# Patient Record
Sex: Female | Born: 1945
Health system: Southern US, Community
[De-identification: ages and names within clinical notes are randomized; demographics above are authoritative.]

## PROBLEM LIST (undated history)

## (undated) DIAGNOSIS — H269 Unspecified cataract: Secondary | ICD-10-CM

## (undated) DIAGNOSIS — I10 Essential (primary) hypertension: Secondary | ICD-10-CM

## (undated) DIAGNOSIS — I471 Supraventricular tachycardia, unspecified: Secondary | ICD-10-CM

## (undated) DIAGNOSIS — C541 Malignant neoplasm of endometrium: Secondary | ICD-10-CM

## (undated) DIAGNOSIS — C801 Malignant (primary) neoplasm, unspecified: Secondary | ICD-10-CM

## (undated) DIAGNOSIS — M1990 Unspecified osteoarthritis, unspecified site: Secondary | ICD-10-CM

## (undated) DIAGNOSIS — K219 Gastro-esophageal reflux disease without esophagitis: Secondary | ICD-10-CM

## (undated) DIAGNOSIS — E785 Hyperlipidemia, unspecified: Secondary | ICD-10-CM

## (undated) HISTORY — PX: CATARACT EXTRACTION W/ INTRAOCULAR LENS IMPLANT: SHX1309

## (undated) HISTORY — DX: Malignant (primary) neoplasm, unspecified: C80.1

## (undated) HISTORY — DX: Malignant neoplasm of endometrium: C54.1

## (undated) HISTORY — DX: Hyperlipidemia, unspecified: E78.5

## (undated) HISTORY — DX: Unspecified cataract: H26.9

## (undated) HISTORY — PX: BREAST SURGERY: SHX581

## (undated) HISTORY — PX: ABLATION: SHX5711

## (undated) HISTORY — DX: Unspecified osteoarthritis, unspecified site: M19.90

## (undated) HISTORY — PX: EYE SURGERY: SHX253

---

## 1999-11-09 ENCOUNTER — Other Ambulatory Visit: Admission: RE | Admit: 1999-11-09 | Discharge: 1999-11-09 | Payer: Self-pay | Admitting: *Deleted

## 1999-12-01 ENCOUNTER — Observation Stay (HOSPITAL_COMMUNITY): Admission: EM | Admit: 1999-12-01 | Discharge: 1999-12-02 | Payer: Self-pay | Admitting: *Deleted

## 2000-02-08 ENCOUNTER — Encounter: Payer: Self-pay | Admitting: Internal Medicine

## 2000-02-08 ENCOUNTER — Ambulatory Visit (HOSPITAL_COMMUNITY): Admission: RE | Admit: 2000-02-08 | Discharge: 2000-02-09 | Payer: Self-pay | Admitting: Internal Medicine

## 2000-11-20 ENCOUNTER — Other Ambulatory Visit: Admission: RE | Admit: 2000-11-20 | Discharge: 2000-11-20 | Payer: Self-pay | Admitting: *Deleted

## 2001-01-15 ENCOUNTER — Inpatient Hospital Stay (HOSPITAL_COMMUNITY): Admission: EM | Admit: 2001-01-15 | Discharge: 2001-01-16 | Payer: Self-pay | Admitting: Emergency Medicine

## 2003-11-23 ENCOUNTER — Other Ambulatory Visit: Admission: RE | Admit: 2003-11-23 | Discharge: 2003-11-23 | Payer: Self-pay | Admitting: Internal Medicine

## 2003-12-16 ENCOUNTER — Ambulatory Visit: Payer: Self-pay | Admitting: Gastroenterology

## 2003-12-21 ENCOUNTER — Ambulatory Visit: Payer: Self-pay | Admitting: Gastroenterology

## 2006-01-02 ENCOUNTER — Other Ambulatory Visit: Admission: RE | Admit: 2006-01-02 | Discharge: 2006-01-02 | Payer: Self-pay | Admitting: Cardiology

## 2007-06-18 ENCOUNTER — Telehealth (INDEPENDENT_AMBULATORY_CARE_PROVIDER_SITE_OTHER): Payer: Self-pay

## 2007-06-18 ENCOUNTER — Ambulatory Visit: Payer: Self-pay | Admitting: Gastroenterology

## 2007-06-18 DIAGNOSIS — K219 Gastro-esophageal reflux disease without esophagitis: Secondary | ICD-10-CM

## 2007-06-18 DIAGNOSIS — K769 Liver disease, unspecified: Secondary | ICD-10-CM | POA: Insufficient documentation

## 2007-06-18 DIAGNOSIS — K625 Hemorrhage of anus and rectum: Secondary | ICD-10-CM

## 2007-06-18 DIAGNOSIS — D126 Benign neoplasm of colon, unspecified: Secondary | ICD-10-CM

## 2007-06-18 LAB — CONVERTED CEMR LAB
Basophils Absolute: 0 10*3/uL (ref 0.0–0.1)
Basophils Relative: 0.6 % (ref 0.0–1.0)
Eosinophils Absolute: 0.2 10*3/uL (ref 0.0–0.7)
Eosinophils Relative: 3.2 % (ref 0.0–5.0)
HCT: 42.9 % (ref 36.0–46.0)
Hemoglobin: 14.2 g/dL (ref 12.0–15.0)
Lymphocytes Relative: 38.1 % (ref 12.0–46.0)
MCHC: 33.1 g/dL (ref 30.0–36.0)
MCV: 87.1 fL (ref 78.0–100.0)
Monocytes Absolute: 0.4 10*3/uL (ref 0.1–1.0)
Monocytes Relative: 7.3 % (ref 3.0–12.0)
Neutro Abs: 3 10*3/uL (ref 1.4–7.7)
Neutrophils Relative %: 50.8 % (ref 43.0–77.0)
Platelets: 298 10*3/uL (ref 150–400)
RBC: 4.92 M/uL (ref 3.87–5.11)
RDW: 12.7 % (ref 11.5–14.6)
WBC: 5.8 10*3/uL (ref 4.5–10.5)

## 2007-07-01 ENCOUNTER — Ambulatory Visit: Payer: Self-pay | Admitting: Gastroenterology

## 2010-05-02 ENCOUNTER — Emergency Department (HOSPITAL_COMMUNITY)
Admission: EM | Admit: 2010-05-02 | Discharge: 2010-05-03 | Disposition: A | Discharge status: Home / Self Care | Payer: Medicare Other | Attending: Emergency Medicine | Admitting: Emergency Medicine

## 2010-05-02 DIAGNOSIS — M779 Enthesopathy, unspecified: Secondary | ICD-10-CM | POA: Insufficient documentation

## 2010-05-02 DIAGNOSIS — M25539 Pain in unspecified wrist: Secondary | ICD-10-CM | POA: Insufficient documentation

## 2010-06-17 NOTE — H&P (Signed)
[facility]  Patient:    Tiffany Gilmore, Tiffany Gilmore                        MRN: [id number] Adm. Date:  [id number] Attending:  Noland Fordyce                         History and Physical  CHIEF COMPLAINT:  Chest pain and dizziness.  HISTORY OF PRESENT ILLNESS:  This is a 65 year old black female who was under my primary care since November 09, 1999, and who presented with complaints of light-headedness and chest pain since mid-morning.  She had a similar-type presentation yesterday and the day before, but did not come into the doctor. She actually states she was not going to come in today but decided to at the last minute.  Her chest discomfort had been getting progressively worse over the last several hours.  In the office, an EKG was done which showed her heart rate to be greater than 200.  EMS was called; she was given adenosine 6 mg IV, which caused a rapid reduction in her heart rate to the 120s.  Of note, she did not respond to vagal maneuvers.  PAST MEDICAL HISTORY 1. SVT.  Further questioning reveals that this patient has one to two episodes    of palpitations each month.  She did not tell me about this on her first    visit, for which I reprimanded her for. 2. Osteoarthritis. 3. Allergic rhinitis. 4. IBS. 5. Breast cancer in 1986. 6. History of some type of hepatitis. 7. Hypercholesterolemia. 8. Urethral strictures. 9. Colon polyp.  SURGICAL HISTORY:  Modified radical mastectomy on the right side in 1986. Several urethral dilations.  BTL.  FAMILY AND SOCIAL HISTORY:  Positive for diabetes in a brother.  Coronary artery disease in a brother at the age of 52; he had a heart attack.  Colon cancer in her mother.  No tobacco.  No alcohol.  She teaches math at a local college.  REVIEW OF SYSTEMS:  Generally, positive for weakness.  HEENT:  Dizziness. CARDIOVASCULAR:  Palpitations and chest pain.  RESPIRATORY:  Mild shortness of breath.  GI:  Negative.   GU:  Negative.  ID:  Negative.  MUSCULOSKELETAL: Negative.  All other systems negative.  PHYSICAL EXAMINATION  VITAL SIGNS:  Blood pressure 120/80.  Pulse was 200, decreased to 120s. Afebrile.  GENERAL:  Alert and cooperative, mildly anxious white female.  HEENT:  Extraocular movements intact.  Oropharynx clear.  Mucosa moist.  NECK:  No bruits.  Supple.  HEART:  Tachycardic.  Regular rhythm.  No murmurs, rubs, or gallops.  LUNGS:  Clear to auscultation bilaterally.  ABDOMEN:  Nondistended, nontender.  Soft.  Positive bowel sounds.  EXTREMITIES:  No cyanosis, clubbing or edema.  NEUROLOGIC:  Cranial nerves II-XII intact.  Gait normal.  LABORATORY DATA:  EKG shows narrowing QRS complex tachycardia at rate of 204.  IMPRESSION AND PLAN:  Paroxysmal supraventricular tachycardia:  With patients history, she certainly needs to be admitted to the hospital and get a cardiac workup.  I will put her on a telemetry bed, get cardiac enzymes serially as well as go ahead and start aspirin.  I gave her Toprol 25 mg in the office; I will continue that in the hospital and on an outpatient basis.  I will check a chest x-ray as well as electrolytes to see if this could be causing this  current problem. DD:  12/01/99 TD:  12/02/99 Job: [id number]

## 2010-06-17 NOTE — Consult Note (Signed)
[facility]  Patient:    Tiffany Gilmore, Tiffany Gilmore                        MRN: [id number] Proc. Date: 12/02/99 Adm. Date:  [id number] Disc. Date: [id number] Attending:  Lilia Pro D CC:         Dr. Silver Huguenin  Terald Sleeper, M.D.   Consultation Report  It is a pleasure to participate in the care of this very nice 65 year old Tiffany Gilmore, a patient of Drs. Leanord Hawking and [place], by providing consultation, at their requests, in regard to [name] episode of rapid heartbeat.  The patient presented to the office with light-headedness and chest pain which had been present since about 10:30.  She arrived at Dr. Laurance Flatten office about one oclock.  She was found to have paroxysmal supraventricular tachycardia with a quite rapid heart rate.  She received intravenous adenosine in the office, and had resolution of the PSCT.  She was hospitalized for further observation and evaluation.  PAST MEDICAL HISTORY:  Includes episodes of rapid heart rate for some years. She says they have rarely been as bothersome as this one was.  It was also associated with pain in her mid back and some dyspnea.  She did not find anything that made the episode better at this time.  She had been seen for a similar problem some years ago while she was under the care of Dr. Candie Echevaria. She did have a particularly bad spell last April.  She says that she usually takes low sodium aspirin and goes to bed when these spells hit, and they resolve that way.  She says that this time she was driving home and could not get across traffic as she was passing Dr. Laurance Flatten office, so went to the office instead of going home.  Past medical history includes right radical mastectomy for breast cancer in 1986, unexplained jaundice in 1989, which was thought possibly to be due to hepatitis or possibly a lightning surge, a prior C-section, urethral strictures, and hypercholesterolemia.  She has also had a  diagnosis of irritable bowel syndrome.  FAMILY HISTORY:  Her brother died early this year, probably of coronary artery disease, at 36.  Her mother died at 83 of colon cancer.  Another brother has diabetes.  She has two daughters who are 52 and 32.  SOCIAL HISTORY:  The patient is married.  She lives at home with her husband. She teaches algebra at [employer]  CURRENT MEDICATIONS:  She does not take any medication regularly.  REVIEW OF SYSTEMS:  CONSTITUTIONAL:  She denies fevers, chills, or sweats. She has no claudication, and has no exertional chest pain.  EYES:  She wears glasses.  She has no diplopia or blurring.  ENT:  No deafness or dizziness. She has a permanent partial left lower bridge.  CARDIOVASCULAR:  See the history of present illness.  RESPIRATORY:  She has mild dyspnea on exertion. She has never smoked.  GASTROINTESTINAL:  She has a history of irritable bowel syndrome and colon polyps, no recent dysphagia, nausea, or vomiting. GENITOURINARY:  She has urethral strictures and has had multiple urethral dilatations.  MUSCULOSKELETAL:  She has painful knees.  SKIN AND BREASTS:  No rash or nodule.  NEUROLOGIC:  No faintness or syncope.  She does have occasional headaches.  ENDOCRINE:  No known diabetes or thyroid disease. ALLERGIC/LYMPHATIC:  She has had breathing problems and rash with SULFA and stomach irritation when  she takes ASPIRIN regularly.  PHYSICAL EXAMINATION:  VITAL SIGNS:  Blood pressure 110/60, pulse 80 and regular, respirations 20 and unlabored, weight 198 pounds, height 4 feet 11 inches.  GENERAL:  She is a well-developed, well-nourished, obese, middle-aged woman now in no acute distress.  She is oriented to person, place, and time.  Her mood and affect are quite pleasant.  HEENT:  Conjunctivae and lids reveal no xanthelasma, icterus, or arcus senilis.  She has most of her own teeth which are in fairly good repair and the permanent bridge noted above.  The oral  mucosa reveals no pallor or cyanosis.  NECK:  Her neck is supple and symmetrical, trachea is midline and mobile. There are no palpable thyromegaly or cervical nodes, no carotid bruit or JVD.  RESPIRATORY:  Her respiratory effort is normal.  Her lungs are clear to auscultation and percussion.  BACK:  Her back is straight without kyphosis, scoliosis, or punch tenderness.  NEUROLOGIC:  Her gait is not tested.  She probably could undergo at least a partial stress test.  Muscle strength and tone are normal.  CARDIAC:  Cardiac apical impulse is cryptic.  The left border of cardiac dullness is within the left anterior axillary line.  The heart rhythm is regular, rate is normal.  There is no gallop, click, or murmur.  EXTREMITIES:  Her digits and nails reveal no clubbing or cyanosis.  Skin and subcutaneous tissue reveal no stasis dermatitis or ulcer.  ABDOMEN:  Her abdomen is obese and nontender, with normal bowel sounds.  There is no palpable enlargement of liver or spleen, and there is no abdominal aortic bruit.  The femoral arteries are without bruits.  The pedal pulses are difficult to feel.  I am not sure I feel any of them.  The appearance of her feet does not suggest ischemia, however.  Her legs reveal no edema or varicosity.  LABORATORY:  Available laboratory data include cardiac enzymes which are negative.  Her initial EKG showed paroxysmal supraventricular tachycardia with a heart rate of about 200.  Subsequent EKG is normal.  Mrs. Cassetta has a long history of paroxysmal supraventricular tachycardia, and has actually had quite a symptomatic episode at this time.  She apparently has never been on continuous medication for the suppression of this arrhythmia.  Her situation is of particular concern because her heart rate is so fast with the supraventricular tachycardia.  At 54, she tolerates this well hemodynamically.  At 62 she may very well not tolerate this very well  because  of the natural aging of the myocardium.  I think it would be worth her while to consider an electrophysiology study and possible _________ of the accessory pathway which is likely to be the cause of the paroxysmal supraventricular tachycardia.  In the meantime I think she can be discharged but should be on beta blockers.  I suggest Lopressor 50 mg a day or the equivalent.  She says that one of her daughters would like to discuss any further evaluation or treatment before she proceeds with it.  I have asked her to plan to come see me in the office the week after next, since I will be out of the office next week, and bring one of her daughters with her so we can discuss this further.  I appreciate the opportunity to see this nice lady.  Obviously if the beta blockers fail to keep her situation under control, then she will really need more aggressive measures. DD:  12/02/99  TD:  12/03/99 Job: [id number]

## 2010-06-17 NOTE — Discharge Summary (Signed)
[facility]  Patient:    Tiffany Gilmore, Tiffany Gilmore                        MRN: [id number] Adm. Date:  [id number] Disc. Date: [id number] Attending:  Lilia Pro D                           Discharge Summary  HISTORY:  This is a very pleasant 65 year old woman who is under the primary care of Dr. Bluford Main, who presented with light-headedness and chest pain since mid morning.  She had some palpitations on Monday and again on Tuesday, but these did not last long, and discussion with this further with the patient, she has a fairly protracted history of at least two episodes of palpitations per month.  During these times, she will lie down, take an aspirin, and the feeling usually passes spontaneously.  On the day of admission, she came to the office because the symptoms did not abate, and, in the office, her heart rate was 200.  She received adenosine which slowed the heart rate.  By the time she was admitted to [facility], she was back in sinus rhythm.  PAST MEDICAL HISTORY: 1. Supraventricular tachycardia, 1-2 episodes per month. 2. Osteoarthritis. 3. Allergic rhinitis. 4. Irritable bowel syndrome. 5. Breast carcinoma, status post mastectomy in 1986. 6. History of hepatitis (exact type uncertain). 7. Hypercholesterolemia. 8. Urethral strictures. 9. Colonic polyps.  SURGERIES:  Urethral dilatations, radical mastectomy on the right in 1986.  FAMILY/SOCIAL HISTORY:  Positive diabetes in her brother, coronary artery disease in a brother at age 40, positive colon carcinoma in mother.  No tobacco, no alcohol.  REVIEW OF SYSTEMS:  Otherwise negative.  PHYSICAL EXAMINATION:  VITAL SIGNS:  Blood pressure was 120/80, pulse 200, she was afebrile.  GENERAL:  This is an alert and cooperative woman in no distress.  HEENT:  Normal.  NECK:  No bruits.  CARDIOVASCULAR:  She was tachycardic, without murmurs, gallops, or rubs.  LUNGS:  Clear.  ABDOMEN:  Soft.  There  were no masses.  EXTREMITIES:  Without peripheral edema.  LABORATORY/OTHER INVESTIGATIONS:  Hemoglobin was 14.2, white count 4.9, platelets 286.  Sodium was 142, potassium 3.5, chloride 110, CO2 26, BUN 7, creatinine 0.8.  The PT PTT were normal.  The LFTs were normal.  Her CK, CK MB, and troponin I were all negative x 2.  The TSH was 2.16.  The EKG was normal.  COURSE IN HOSPITAL:  This patient was admitted with acute onset of supraventricular tachycardia.  She was started on Toprol XL.  She remained in sinus rhythm throughout the hospital stay.  As mentioned, she did receive adenosine in the office, prior to transport.  She was seen in consultation with Dr. Lucas Mallow, who generally feels that this is happening often enough and severe enough that she probably should be considered for an EPS study and catheter ablation if possible.  The patient prefers to think about this and will return to see Dr. Lucas Mallow in two weeks time.  DISCHARGE MEDICATIONS:  Toprol XL 50 mg per day.  FOLLOW-UP:  Dr. Lucas Mallow in two weeks and Dr. Bluford Main p.r.n. DD:  12/02/99 TD:  12/03/99 Job: [id number]

## 2010-06-17 NOTE — Discharge Summary (Signed)
[place]  Patient:    Tiffany Gilmore, Tiffany Gilmore                        MRN: [id number] Adm. Date:  [id number] Disc. Date: [id number] Attending:  Lewayne Bunting Dictator:   Chinita Pester, N.P. CC:         Lilia Pro, M.D.  Terald Sleeper, M.D.  Jaclyn Prime. Lucas Mallow, M.D.  Kathrine Cords, R.N.   Discharge Summary  PRIMARY DIAGNOSIS:  Supraventricular tachycardia.  SECONDARY DIAGNOSES:  None.  HISTORY OF PRESENT ILLNESS:  This is a 65 year old female with a history of tachypalpitations for many years.  In retrospect, the patient thinks that she had symptoms beginning as a teenager early in the 33s.  She had one particularly bad episode resulting in presentation to the emergency room. There very strong vagal maneuvers subsequently resulted in the termination of tachypalpitations.  She had rare prolonged palpitations until several months ago when she had a particularly bad episode in October, prompting her to visit the emergency department after being seen at her primary care physicians office.  She was treated with adenosine, observed, and subsequently discharged to home.  In the patient she has taken multiple medications, including beta blockers, which resulted in profound fatigue, and she is presently on Tiazac. She continued to have symptoms on these medications and notes that during her episodes of tachypalpitations, which start and stop suddenly that she has chest pain, shortness of breath, and near syncope.  The spells can last over an hour in duration, although they are usually of a shorter duration.  PAST SURGICAL HISTORY:  Positive for post mastectomy.  PAST MEDICAL HISTORY:  Transient liver dysfunction.  SOCIAL HISTORY:  Married with two daughters.  Denies tobacco or alcohol.  The patient was admitted for an SVT ablation.  She underwent an EP study and had a successful slow pathway modification of the visible AV nodal re-entrant tachycardia,  run during the tachycardia not inducible.  There were no immediate postoperative complications.  The patient tolerated the procedure well and was eventually discharged the following day in stable condition.  DISCHARGE MEDICATIONS:  She was instructed to take Tylenol p.r.n. for any discomfort.  She was to take a baby enteric-coated aspirin daily for six weeks and antibiotic prophylaxis for three months prior to any dental work.  ACTIVITY:  She is not to have strenuous activity for the next four days or driving for two days.  DIET:  Low-fat, low-cholesterol diet.  WOUND CARE:  She is to keep the puncture sites clean and dry.  To call if there is any drainage.  She was allowed to shower.  FOLLOW-UP:  The patient is to follow with Doylene Canning. Ladona Ridgel, M.D., on February 29, 2000, at 3:30 p.m. DD:  02/09/00 TD:  02/10/00 Job: [id number]

## 2010-06-17 NOTE — Procedures (Signed)
[place]  Patient:    Tiffany, Gilmore                        MRN: [id number] Proc. Date: 02/08/00 Adm. Date:  [id number] Attending:  Lewayne Bunting CC:         Jaclyn Prime. Lucas Mallow, M.D.  Lilia Pro, M.D.  Terald Sleeper, M.D.   Procedure Report  PROCEDURE PERFORMED:  Electrophysiology study and RF catheter ablation. of supraventricular tachycardia.  I:  INTRODUCTION:  The patient is very pleasant 65 year old woman, who has a documented history of SVT.  She had been hospitalized most recently back in November for evaluation.  The patient said that she has had symptoms beginning as a teenager, but early in the 1980s she began to have particularly bad episodes which typically could be terminated with vagal maneuvers.  She had a prolonged episode back in October, at which time she had documentation of her SVT and its termination with intravenous adenosine.  She had been taking calcium channel blockers, but unfortunately these have resulted in fatigue, and she is subsequently referred for electrophysiology study.  II:  DESCRIPTION OF PROCEDURE:  After informed consent was obtained, the patient was taken to the diagnostic EP Lab in a fasting state.  After the usual preparation and draping, intravenous fentanyl and midazolam were given for sedation.  A 6 French hexapolar catheter was inserted percutaneously in the right jugular vein, advanced to the coronary sinus.  A 5 French quadripolar catheter was inserted percutaneously in the right femoral vein and advanced to the RV apex.  A 5 French quadripolar catheter was inserted percutaneously in the right femoral vein and advanced to the His bundle region.  A 5 French quadripolar ablation catheter was inserted percutaneously in the right femoral vein and advanced to the right atrium.  After measurement at the basic intervals, rapid ventricular pacing was carried out from the RV apex at a pacing cycle  length of 490 msec and stepwise decreased down to 310 msec where VA Wenckebach was observed.  During rapid ventricular pacing, the atrial activation sequence was midline and decremental.  Next, programed ventricular stimulation was carried out from the RV apex at a basic dry cycle length at 500 msec.  The S1-S2 interval and stepwise decreased down to 230 msec where a ventricular refractoriness was observed.  During programed ventricular stimulation, the atrial activation sequence was once again midline and decremental.  Next, rapid atrial pacing was carried out from the coronary sinus with a basic cycle length of 490 msec, and stepwise decreased down to 310 msec where AV Wenckebach was observed.  During rapid atrial pacing, the P-R interval remained less than the R interval.  Next, programed atrial stimulation was carried out from the coronary sinus with basic dry cycle length of 500 msec.  The S1-S2 interval and stepwise decreased down to 230 msec where atrial refractoriness was observed.  It should be noted that during programed atrial stimulation, there was no age jump and no SVT.  Isoproterenol at 1 mcg/min. was then infused.  In addition, rapid atrial pacing was carried out from both coronary sinuses and a from the high right atrium; had a pacing cycle length of 290 msec from the high right atrium.  The patient had easily inducible AV nodal re-entry tachycardia.  This is characterized by PVCs placed at the time of His bundle refractoriness, demonstrated no atrial preexcitation, and from ventricular pacing demonstrated a V-A-V  conduction sequence.  The V-A interval was essentially 0 during SVT. The 5 French quadripolar catheter was removed from the right atrium, and the ablation catheter was maneuvered into the right atrium and mapping of Kochs triangle was carried out.  It should be noted that Kochs triangle was quite small.  A total of 9 RF energy applications were delivered to  Kochs triangle.  This resulted in multiple episodes of accelerated junctional rhythm.  It should be noted that during ablation the ablation catheter tended to move superiorly towards the AV node.  Despite this, no heart block was observed. Following the ninth RF energy application, additional rapid atrial pacing was carried out during isoproterenol infusion, and there was no evidence of recurrent inducible SVT.  Initially it was thought that the P-R interval remained greater than the R interval, but on careful inspection, there was a considerable latency and the P-R interval was actually less than the R interval.  At this point, the patient was observed for a total of 30 minutes and no additional SVT was observed during rapid atrial pacing.  The catheters were removed, hemostasis assured and the patient was returned to her room in good condition.  III:  COMPLICATIONS:  There were no immediate procedure complications.  IV:  RESULTS:      a. Baseline ECG:  The baseline 12-lead ECG demonstrates normal         sinus rhythm with normal axis and intervals.      b. Baseline Intervals:  Sinus cycle length was 645 msec.  The H-V         interval was 41 msec.      c. Rapid ventricular pacing:  Rapid ventricular pacing was carried out         from the RV apex and stepwise decreased down to 310 msec where         V-A Wenckebach was observed.      d. Programed ventricular stimulation:  Programed ventricular stimulation         was carried out from the RV apex at basic dry cycle lengths of 500         msec with the S1-S2 interval and stepwise decreased down to 230         msec where a ventricular refractoriness was observed.  During         programed ventricular stimulation, the atrial activation sequence         remained midline and decremental.      e. Rapid atrial pacing:  Rapid atrial pacing was carried out from both          high right atrium and coronary sinus with the A-V Wenckebach cycle          length of 310 msec at baseline and 240 msec during isoproterenol         infusion.      f. Programed atrial stimulation:  Programed atrial stimulation was         carried out from the coronary sinus as well as high right atrium at         basic dry cycle length of 504 msec.  The S1-S2 interval and stepwise         decreased down to atrial refractoriness, but there was no inducible         SVT. In addition, there were no age jumps.      g. Arrhythmia observed:  AV nodal re-entry tachycardia initiation.  Rapid atrial pacing during isoproterenol infusion.  Duration         sustained.  Termination with rapid atrial pacing.  The cycle length         was 288 msec.      h. Mapping:  Mapping with Kochs triangle demonstrated extremely compact         Kochs triangle.      i. RF energy application.  A total of 9 RF energy applications were         delivered to sites 6 through 8 in Kochs triangle.  During RF energy         application, the catheter would move up to site 5 and even site 4 and         Kochs triangle.  There was excellerated junctional rhythm during RF         energy application.  V:  CONCLUSION:  This study demonstrates successful slow pathway modification of inducible AV nodal re-entry tachycardia rendering the tachycardia noninducible.  There were no immediate procedure complications. DD:  02/08/00 TD:  02/08/00 Job: [id number]

## 2010-06-17 NOTE — Discharge Summary (Signed)
[place]. [facility]  Patient:    Tiffany Gilmore, Tiffany Gilmore Visit Number: [id number] MRN: [id number]          Service Type: MED Location: [phone] Attending Physician:  Clovis Cao Dictated by:   Jaclyn Prime Lucas Mallow, M.D. Admit Date:  01/15/2001 Discharge Date: 01/16/2001                             Discharge Summary  CHIEF COMPLAINT:  Chest pain.  HISTORY:  This 65 year old woman who had had an electrophysiology procedure two weeks ago to reduce the frequency and severity of her episodes of supraventricular tachycardia presented to the emergency room with a chief complaint of chest pain which had started shortly after her EP procedure.  The pain was described as sharp, in the left upper chest, worsened by nothing, relieved by nothing with onset at a time she could not recall, and of mild severity.  The pain was gone by the time she got to the emergency room.  The patient was admitted to the "rule out MI unit."  PAST MEDICAL HISTORY:  Recorded to the required degree by the emergency room physician.  SOCIAL HISTORY:  Recorded to the required degree by the emergency room physician.  FAMILY HISTORY:  Recorded to the required degree by the emergency room physician.  PHYSICAL EXAMINATION  VITAL SIGNS:  Temperature 97.7, pulse rate 64, respirations 20, blood pressure 112/64, height 4 feet 11 inches, weight 171 pounds.  GENERAL:  She is a moderately overweight, otherwise well-developed, well-nourished middle aged woman no longer complaining of chest pain.  General physical examination was unremarkable.  CARDIAC:  Cardiac apical impulse was not recorded.  Heart rhythm was regular. Rate was normal.  No gallop, click, or murmur.  LABORATORIES:  White cells 4.8, hemoglobin 16 initially, dropping to 13.1 18 minutes later, hematocrit 47 dropping to 38, mean cell volume 85, platelets 263,000.  Potassium 7.8 at 1632 and 3.6 at 1650.  The 7.8 apparently had  been artifactual.  Remainder of the renal profile was normal.  Total CPK 266 with MB band 1.8, troponin I 0.01 and 0.02.  Cholesterol 247, triglycerides 80, HDL 63, LDL 168 with a nonfasting specimen.  HOSPITAL COURSE:  The patient was placed in a telemetry bed and monitored overnight.  She had no further chest pain and her enzymes were negative.  Her chest pain was felt to be atypical and there was no objective evidence of myocardial ischemia.  EKG on admission showed left atrial enlargement and a repeat the following morning was unchanged with no serial change consistent with ischemia.  The patient was found to have a low probability of unstable angina following evaluation and was considered ready for discharge on her usual medications. She will require further evaluation as an outpatient if she has further chest pain. Dictated by:   Jaclyn Prime. Lucas Mallow, M.D. Attending Physician:  Clovis Cao DD:  02/06/01 TD:  02/06/01 Job: [phone] [id number]

## 2011-04-11 DIAGNOSIS — M76829 Posterior tibial tendinitis, unspecified leg: Secondary | ICD-10-CM | POA: Diagnosis not present

## 2011-04-20 DIAGNOSIS — M76829 Posterior tibial tendinitis, unspecified leg: Secondary | ICD-10-CM | POA: Diagnosis not present

## 2011-07-26 DIAGNOSIS — R5381 Other malaise: Secondary | ICD-10-CM | POA: Diagnosis not present

## 2011-07-26 DIAGNOSIS — M159 Polyosteoarthritis, unspecified: Secondary | ICD-10-CM | POA: Diagnosis not present

## 2011-07-26 DIAGNOSIS — Z780 Asymptomatic menopausal state: Secondary | ICD-10-CM | POA: Diagnosis not present

## 2011-07-26 DIAGNOSIS — R5383 Other fatigue: Secondary | ICD-10-CM | POA: Diagnosis not present

## 2011-07-26 DIAGNOSIS — M549 Dorsalgia, unspecified: Secondary | ICD-10-CM | POA: Diagnosis not present

## 2011-07-26 DIAGNOSIS — E782 Mixed hyperlipidemia: Secondary | ICD-10-CM | POA: Diagnosis not present

## 2011-08-02 ENCOUNTER — Other Ambulatory Visit (HOSPITAL_COMMUNITY)
Admission: RE | Admit: 2011-08-02 | Discharge: 2011-08-02 | Disposition: A | Discharge status: Home / Self Care | Payer: Medicare Other | Source: Ambulatory Visit | Attending: Internal Medicine | Admitting: Internal Medicine

## 2011-08-02 DIAGNOSIS — Z23 Encounter for immunization: Secondary | ICD-10-CM | POA: Diagnosis not present

## 2011-08-02 DIAGNOSIS — Z124 Encounter for screening for malignant neoplasm of cervix: Secondary | ICD-10-CM | POA: Diagnosis not present

## 2011-08-02 DIAGNOSIS — M159 Polyosteoarthritis, unspecified: Secondary | ICD-10-CM | POA: Diagnosis not present

## 2011-08-02 DIAGNOSIS — R5381 Other malaise: Secondary | ICD-10-CM | POA: Diagnosis not present

## 2011-08-02 DIAGNOSIS — E782 Mixed hyperlipidemia: Secondary | ICD-10-CM | POA: Diagnosis not present

## 2011-08-02 DIAGNOSIS — Z780 Asymptomatic menopausal state: Secondary | ICD-10-CM | POA: Diagnosis not present

## 2011-08-02 DIAGNOSIS — R5383 Other fatigue: Secondary | ICD-10-CM | POA: Diagnosis not present

## 2011-08-02 DIAGNOSIS — E2839 Other primary ovarian failure: Secondary | ICD-10-CM | POA: Diagnosis not present

## 2011-10-27 DIAGNOSIS — Z961 Presence of intraocular lens: Secondary | ICD-10-CM | POA: Diagnosis not present

## 2012-02-01 DIAGNOSIS — Z853 Personal history of malignant neoplasm of breast: Secondary | ICD-10-CM | POA: Diagnosis not present

## 2012-02-01 DIAGNOSIS — Z1231 Encounter for screening mammogram for malignant neoplasm of breast: Secondary | ICD-10-CM | POA: Diagnosis not present

## 2012-04-09 DIAGNOSIS — R05 Cough: Secondary | ICD-10-CM | POA: Diagnosis not present

## 2012-04-09 DIAGNOSIS — J069 Acute upper respiratory infection, unspecified: Secondary | ICD-10-CM | POA: Diagnosis not present

## 2012-04-09 DIAGNOSIS — IMO0001 Reserved for inherently not codable concepts without codable children: Secondary | ICD-10-CM | POA: Diagnosis not present

## 2012-04-09 DIAGNOSIS — R5383 Other fatigue: Secondary | ICD-10-CM | POA: Diagnosis not present

## 2012-04-09 DIAGNOSIS — R5381 Other malaise: Secondary | ICD-10-CM | POA: Diagnosis not present

## 2012-04-29 ENCOUNTER — Ambulatory Visit (INDEPENDENT_AMBULATORY_CARE_PROVIDER_SITE_OTHER): Payer: Medicare Other | Admitting: Family Medicine

## 2012-04-29 VITALS — BP 204/91 | HR 81 | Temp 97.9°F | Resp 16 | Ht 58.5 in | Wt 204.0 lb

## 2012-04-29 DIAGNOSIS — M5010 Cervical disc disorder with radiculopathy, unspecified cervical region: Secondary | ICD-10-CM

## 2012-04-29 DIAGNOSIS — M5412 Radiculopathy, cervical region: Secondary | ICD-10-CM

## 2012-04-29 MED ORDER — HYDROCODONE-ACETAMINOPHEN 5-325 MG PO TABS: 1.0000 | ORAL_TABLET | Freq: Four times a day (QID) | ORAL | Status: DC | PRN

## 2012-04-29 MED ORDER — PREDNISONE 20 MG PO TABS: ORAL_TABLET | ORAL | Status: DC

## 2012-04-29 NOTE — Progress Notes (Signed)
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  No weakness, no numbness.  No h/o blood pressure hx.  Objective:  NAD BP recheck L arm sitting 130/80 Chest clear Heart:  Reg, no murmur Arm strength normal Neck ROM normal Reflexes in upper arms:  Normal  Assessment:  C5 radiculopathy  Plan:  Cervical disc disorder with radiculopathy of cervical region - Plan: predniSONE (DELTASONE) 20 MG tablet, HYDROcodone-acetaminophen (NORCO) 5-325 MG per tablet  Return 48 hours INB \ Signed, Elvina Sidle, MD

## 2012-04-29 NOTE — Patient Instructions (Addendum)
Cervical Radiculopathy  Cervical radiculopathy happens when a nerve in the neck is pinched or bruised by a slipped (herniated) disk or by arthritic changes in the bones of the cervical spine. This can occur due to an injury or as part of the normal aging process. Pressure on the cervical nerves can cause pain or numbness that runs from your neck all the way down into your arm and fingers.  CAUSES   There are many possible causes, including:   Injury.   Muscle tightness in the neck from overuse.   Swollen, painful joints (arthritis).   Breakdown or degeneration in the bones and joints of the spine (spondylosis) due to aging.   Bone spurs that may develop near the cervical nerves.  SYMPTOMS   Symptoms include pain, weakness, or numbness in the affected arm and hand. Pain can be severe or irritating. Symptoms may be worse when extending or turning the neck.  DIAGNOSIS   Your caregiver will ask about your symptoms and do a physical exam. He or she may test your strength and reflexes. X-rays, CT scans, and MRI scans may be needed in cases of injury or if the symptoms do not go away after a period of time. Electromyography (EMG) or nerve conduction testing may be done to study how your nerves and muscles are working.  TREATMENT   Your caregiver may recommend certain exercises to help relieve your symptoms. Cervical radiculopathy can, and often does, get better with time and treatment. If your problems continue, treatment options may include:   Wearing a soft collar for short periods of time.   Physical therapy to strengthen the neck muscles.   Medicines, such as nonsteroidal anti-inflammatory drugs (NSAIDs), oral corticosteroids, or spinal injections.   Surgery. Different types of surgery may be done depending on the cause of your problems.  HOME CARE INSTRUCTIONS    Put ice on the affected area.   Put ice in a plastic bag.   Place a towel between your skin and the bag.   Leave the ice on for 15 to 20  minutes, 3 to 4 times a day or as directed by your caregiver.   If ice does not help, you can try using heat. Take a warm shower or bath, or use a hot water bottle as directed by your caregiver.   You may try a gentle neck and shoulder massage.   Use a flat pillow when you sleep.   Only take over-the-counter or prescription medicines for pain, discomfort, or fever as directed by your caregiver.   If physical therapy was prescribed, follow your caregiver's directions.   If a soft collar was prescribed, use it as directed.  SEEK IMMEDIATE MEDICAL CARE IF:    Your pain gets much worse and cannot be controlled with medicines.   You have weakness or numbness in your hand, arm, face, or leg.   You have a high fever or a stiff, rigid neck.   You lose bowel or bladder control (incontinence).   You have trouble with walking, balance, or speaking.  MAKE SURE YOU:    Understand these instructions.   Will watch your condition.   Will get help right away if you are not doing well or get worse.  Document Released: 10/11/2000 Document Revised: 04/10/2011 Document Reviewed: 08/30/2010  [employer] Patient Information 2013 [employer].

## 2012-08-22 DIAGNOSIS — R5381 Other malaise: Secondary | ICD-10-CM | POA: Diagnosis not present

## 2012-08-22 DIAGNOSIS — M159 Polyosteoarthritis, unspecified: Secondary | ICD-10-CM | POA: Diagnosis not present

## 2012-08-22 DIAGNOSIS — N951 Menopausal and female climacteric states: Secondary | ICD-10-CM | POA: Diagnosis not present

## 2012-08-22 DIAGNOSIS — Z0000 Encounter for general adult medical examination without abnormal findings: Secondary | ICD-10-CM | POA: Diagnosis not present

## 2012-08-22 DIAGNOSIS — E782 Mixed hyperlipidemia: Secondary | ICD-10-CM | POA: Diagnosis not present

## 2012-08-29 DIAGNOSIS — N951 Menopausal and female climacteric states: Secondary | ICD-10-CM | POA: Diagnosis not present

## 2012-08-29 DIAGNOSIS — L408 Other psoriasis: Secondary | ICD-10-CM | POA: Diagnosis not present

## 2012-08-29 DIAGNOSIS — E782 Mixed hyperlipidemia: Secondary | ICD-10-CM | POA: Diagnosis not present

## 2012-08-29 DIAGNOSIS — M542 Cervicalgia: Secondary | ICD-10-CM | POA: Diagnosis not present

## 2012-08-29 DIAGNOSIS — M159 Polyosteoarthritis, unspecified: Secondary | ICD-10-CM | POA: Diagnosis not present

## 2012-10-07 DIAGNOSIS — L259 Unspecified contact dermatitis, unspecified cause: Secondary | ICD-10-CM | POA: Diagnosis not present

## 2012-11-14 DIAGNOSIS — L259 Unspecified contact dermatitis, unspecified cause: Secondary | ICD-10-CM | POA: Diagnosis not present

## 2012-12-25 DIAGNOSIS — Z961 Presence of intraocular lens: Secondary | ICD-10-CM | POA: Diagnosis not present

## 2012-12-25 DIAGNOSIS — H524 Presbyopia: Secondary | ICD-10-CM | POA: Diagnosis not present

## 2013-01-09 DIAGNOSIS — L408 Other psoriasis: Secondary | ICD-10-CM | POA: Diagnosis not present

## 2013-01-09 DIAGNOSIS — E782 Mixed hyperlipidemia: Secondary | ICD-10-CM | POA: Diagnosis not present

## 2013-01-09 DIAGNOSIS — R002 Palpitations: Secondary | ICD-10-CM | POA: Diagnosis not present

## 2013-01-15 ENCOUNTER — Other Ambulatory Visit: Payer: Self-pay | Admitting: Cardiology

## 2013-01-15 ENCOUNTER — Ambulatory Visit
Admission: RE | Admit: 2013-01-15 | Discharge: 2013-01-15 | Disposition: A | Discharge status: Home / Self Care | Payer: Medicare Other | Source: Ambulatory Visit | Attending: Cardiology | Admitting: Cardiology

## 2013-01-15 DIAGNOSIS — R0602 Shortness of breath: Secondary | ICD-10-CM

## 2013-01-15 DIAGNOSIS — I4891 Unspecified atrial fibrillation: Secondary | ICD-10-CM | POA: Diagnosis not present

## 2013-01-15 DIAGNOSIS — R002 Palpitations: Secondary | ICD-10-CM | POA: Diagnosis not present

## 2013-01-16 DIAGNOSIS — R002 Palpitations: Secondary | ICD-10-CM | POA: Diagnosis not present

## 2013-01-16 DIAGNOSIS — R0602 Shortness of breath: Secondary | ICD-10-CM | POA: Diagnosis not present

## 2013-02-17 DIAGNOSIS — Z1231 Encounter for screening mammogram for malignant neoplasm of breast: Secondary | ICD-10-CM | POA: Diagnosis not present

## 2013-03-12 DIAGNOSIS — R0602 Shortness of breath: Secondary | ICD-10-CM | POA: Diagnosis not present

## 2013-09-11 DIAGNOSIS — R002 Palpitations: Secondary | ICD-10-CM | POA: Diagnosis not present

## 2013-09-11 DIAGNOSIS — E782 Mixed hyperlipidemia: Secondary | ICD-10-CM | POA: Diagnosis not present

## 2013-09-30 DIAGNOSIS — L408 Other psoriasis: Secondary | ICD-10-CM | POA: Diagnosis not present

## 2013-09-30 DIAGNOSIS — E782 Mixed hyperlipidemia: Secondary | ICD-10-CM | POA: Diagnosis not present

## 2013-09-30 DIAGNOSIS — M159 Polyosteoarthritis, unspecified: Secondary | ICD-10-CM | POA: Diagnosis not present

## 2013-09-30 DIAGNOSIS — N951 Menopausal and female climacteric states: Secondary | ICD-10-CM | POA: Diagnosis not present

## 2014-04-10 DIAGNOSIS — E782 Mixed hyperlipidemia: Secondary | ICD-10-CM | POA: Diagnosis not present

## 2014-04-21 DIAGNOSIS — E782 Mixed hyperlipidemia: Secondary | ICD-10-CM | POA: Diagnosis not present

## 2014-04-21 DIAGNOSIS — Z780 Asymptomatic menopausal state: Secondary | ICD-10-CM | POA: Diagnosis not present

## 2014-09-18 DIAGNOSIS — Z853 Personal history of malignant neoplasm of breast: Secondary | ICD-10-CM | POA: Diagnosis not present

## 2014-09-18 DIAGNOSIS — Z1231 Encounter for screening mammogram for malignant neoplasm of breast: Secondary | ICD-10-CM | POA: Diagnosis not present

## 2014-10-21 ENCOUNTER — Encounter: Payer: Self-pay | Admitting: Gastroenterology

## 2014-10-30 DIAGNOSIS — E782 Mixed hyperlipidemia: Secondary | ICD-10-CM | POA: Diagnosis not present

## 2014-10-30 DIAGNOSIS — Z780 Asymptomatic menopausal state: Secondary | ICD-10-CM | POA: Diagnosis not present

## 2014-10-30 DIAGNOSIS — L409 Psoriasis, unspecified: Secondary | ICD-10-CM | POA: Diagnosis not present

## 2014-10-30 DIAGNOSIS — M150 Primary generalized (osteo)arthritis: Secondary | ICD-10-CM | POA: Diagnosis not present

## 2014-11-04 DIAGNOSIS — E782 Mixed hyperlipidemia: Secondary | ICD-10-CM | POA: Diagnosis not present

## 2014-11-04 DIAGNOSIS — M150 Primary generalized (osteo)arthritis: Secondary | ICD-10-CM | POA: Diagnosis not present

## 2014-11-04 DIAGNOSIS — Z780 Asymptomatic menopausal state: Secondary | ICD-10-CM | POA: Diagnosis not present

## 2015-03-12 DIAGNOSIS — H524 Presbyopia: Secondary | ICD-10-CM | POA: Diagnosis not present

## 2015-03-12 DIAGNOSIS — H33301 Unspecified retinal break, right eye: Secondary | ICD-10-CM | POA: Diagnosis not present

## 2015-03-12 DIAGNOSIS — Z961 Presence of intraocular lens: Secondary | ICD-10-CM | POA: Diagnosis not present

## 2015-05-19 DIAGNOSIS — E782 Mixed hyperlipidemia: Secondary | ICD-10-CM | POA: Diagnosis not present

## 2015-05-31 DIAGNOSIS — M150 Primary generalized (osteo)arthritis: Secondary | ICD-10-CM | POA: Diagnosis not present

## 2015-05-31 DIAGNOSIS — I10 Essential (primary) hypertension: Secondary | ICD-10-CM | POA: Diagnosis not present

## 2015-05-31 DIAGNOSIS — E782 Mixed hyperlipidemia: Secondary | ICD-10-CM | POA: Diagnosis not present

## 2015-10-21 DIAGNOSIS — N3000 Acute cystitis without hematuria: Secondary | ICD-10-CM | POA: Diagnosis not present

## 2015-11-22 DIAGNOSIS — E782 Mixed hyperlipidemia: Secondary | ICD-10-CM | POA: Diagnosis not present

## 2015-11-22 DIAGNOSIS — I10 Essential (primary) hypertension: Secondary | ICD-10-CM | POA: Diagnosis not present

## 2015-11-22 DIAGNOSIS — Z0000 Encounter for general adult medical examination without abnormal findings: Secondary | ICD-10-CM | POA: Diagnosis not present

## 2015-11-22 DIAGNOSIS — Z780 Asymptomatic menopausal state: Secondary | ICD-10-CM | POA: Diagnosis not present

## 2015-11-29 DIAGNOSIS — I10 Essential (primary) hypertension: Secondary | ICD-10-CM | POA: Diagnosis not present

## 2015-11-29 DIAGNOSIS — M8589 Other specified disorders of bone density and structure, multiple sites: Secondary | ICD-10-CM | POA: Diagnosis not present

## 2015-11-29 DIAGNOSIS — E782 Mixed hyperlipidemia: Secondary | ICD-10-CM | POA: Diagnosis not present

## 2016-03-13 DIAGNOSIS — H33301 Unspecified retinal break, right eye: Secondary | ICD-10-CM | POA: Diagnosis not present

## 2016-03-13 DIAGNOSIS — Z961 Presence of intraocular lens: Secondary | ICD-10-CM | POA: Diagnosis not present

## 2016-05-17 DIAGNOSIS — I10 Essential (primary) hypertension: Secondary | ICD-10-CM | POA: Diagnosis not present

## 2016-05-17 DIAGNOSIS — E782 Mixed hyperlipidemia: Secondary | ICD-10-CM | POA: Diagnosis not present

## 2016-05-22 DIAGNOSIS — M150 Primary generalized (osteo)arthritis: Secondary | ICD-10-CM | POA: Diagnosis not present

## 2016-05-22 DIAGNOSIS — E782 Mixed hyperlipidemia: Secondary | ICD-10-CM | POA: Diagnosis not present

## 2016-05-22 DIAGNOSIS — I10 Essential (primary) hypertension: Secondary | ICD-10-CM | POA: Diagnosis not present

## 2016-06-14 DIAGNOSIS — G44201 Tension-type headache, unspecified, intractable: Secondary | ICD-10-CM | POA: Diagnosis not present

## 2016-09-27 DIAGNOSIS — J22 Unspecified acute lower respiratory infection: Secondary | ICD-10-CM | POA: Diagnosis not present

## 2016-11-06 DIAGNOSIS — Z1231 Encounter for screening mammogram for malignant neoplasm of breast: Secondary | ICD-10-CM | POA: Diagnosis not present

## 2016-11-06 DIAGNOSIS — Z853 Personal history of malignant neoplasm of breast: Secondary | ICD-10-CM | POA: Diagnosis not present

## 2016-11-20 DIAGNOSIS — N6489 Other specified disorders of breast: Secondary | ICD-10-CM | POA: Diagnosis not present

## 2016-11-20 DIAGNOSIS — R921 Mammographic calcification found on diagnostic imaging of breast: Secondary | ICD-10-CM | POA: Diagnosis not present

## 2016-11-28 ENCOUNTER — Other Ambulatory Visit: Payer: Self-pay | Admitting: Radiology

## 2016-11-28 DIAGNOSIS — D242 Benign neoplasm of left breast: Secondary | ICD-10-CM | POA: Diagnosis not present

## 2016-11-28 DIAGNOSIS — R921 Mammographic calcification found on diagnostic imaging of breast: Secondary | ICD-10-CM | POA: Diagnosis not present

## 2017-02-09 DIAGNOSIS — I10 Essential (primary) hypertension: Secondary | ICD-10-CM | POA: Diagnosis not present

## 2017-02-09 DIAGNOSIS — Z0000 Encounter for general adult medical examination without abnormal findings: Secondary | ICD-10-CM | POA: Diagnosis not present

## 2017-02-09 DIAGNOSIS — E782 Mixed hyperlipidemia: Secondary | ICD-10-CM | POA: Diagnosis not present

## 2017-02-09 DIAGNOSIS — M150 Primary generalized (osteo)arthritis: Secondary | ICD-10-CM | POA: Diagnosis not present

## 2017-02-16 DIAGNOSIS — L409 Psoriasis, unspecified: Secondary | ICD-10-CM | POA: Diagnosis not present

## 2017-02-16 DIAGNOSIS — M150 Primary generalized (osteo)arthritis: Secondary | ICD-10-CM | POA: Diagnosis not present

## 2017-02-16 DIAGNOSIS — E782 Mixed hyperlipidemia: Secondary | ICD-10-CM | POA: Diagnosis not present

## 2017-02-16 DIAGNOSIS — I10 Essential (primary) hypertension: Secondary | ICD-10-CM | POA: Diagnosis not present

## 2017-02-24 DIAGNOSIS — R05 Cough: Secondary | ICD-10-CM | POA: Diagnosis not present

## 2017-03-14 DIAGNOSIS — Z961 Presence of intraocular lens: Secondary | ICD-10-CM | POA: Diagnosis not present

## 2017-03-14 DIAGNOSIS — H33301 Unspecified retinal break, right eye: Secondary | ICD-10-CM | POA: Diagnosis not present

## 2017-03-28 ENCOUNTER — Encounter: Payer: Self-pay | Admitting: Internal Medicine

## 2017-05-24 ENCOUNTER — Encounter: Payer: Self-pay | Admitting: Gastroenterology

## 2017-05-29 DIAGNOSIS — R928 Other abnormal and inconclusive findings on diagnostic imaging of breast: Secondary | ICD-10-CM | POA: Diagnosis not present

## 2017-05-29 DIAGNOSIS — Z853 Personal history of malignant neoplasm of breast: Secondary | ICD-10-CM | POA: Diagnosis not present

## 2017-08-14 DIAGNOSIS — R1011 Right upper quadrant pain: Secondary | ICD-10-CM | POA: Diagnosis not present

## 2017-08-15 ENCOUNTER — Other Ambulatory Visit (HOSPITAL_COMMUNITY): Payer: Self-pay | Admitting: Internal Medicine

## 2017-08-15 ENCOUNTER — Ambulatory Visit (HOSPITAL_COMMUNITY)
Admission: RE | Admit: 2017-08-15 | Discharge: 2017-08-15 | Disposition: A | Discharge status: Home / Self Care | Payer: Medicare Other | Source: Ambulatory Visit | Attending: Internal Medicine | Admitting: Internal Medicine

## 2017-08-15 DIAGNOSIS — G8929 Other chronic pain: Secondary | ICD-10-CM

## 2017-08-15 DIAGNOSIS — R1011 Right upper quadrant pain: Principal | ICD-10-CM

## 2017-08-15 DIAGNOSIS — R109 Unspecified abdominal pain: Secondary | ICD-10-CM | POA: Diagnosis not present

## 2017-08-22 ENCOUNTER — Other Ambulatory Visit: Payer: Self-pay | Admitting: Internal Medicine

## 2017-08-22 DIAGNOSIS — R1011 Right upper quadrant pain: Secondary | ICD-10-CM

## 2017-08-27 DIAGNOSIS — E782 Mixed hyperlipidemia: Secondary | ICD-10-CM | POA: Diagnosis not present

## 2017-08-27 DIAGNOSIS — I10 Essential (primary) hypertension: Secondary | ICD-10-CM | POA: Diagnosis not present

## 2017-09-07 DIAGNOSIS — R1011 Right upper quadrant pain: Secondary | ICD-10-CM | POA: Diagnosis not present

## 2017-09-07 DIAGNOSIS — E782 Mixed hyperlipidemia: Secondary | ICD-10-CM | POA: Diagnosis not present

## 2017-09-07 DIAGNOSIS — I10 Essential (primary) hypertension: Secondary | ICD-10-CM | POA: Diagnosis not present

## 2017-09-14 ENCOUNTER — Ambulatory Visit
Admission: RE | Admit: 2017-09-14 | Discharge: 2017-09-14 | Disposition: A | Discharge status: Home / Self Care | Payer: Medicare Other | Source: Ambulatory Visit | Attending: Internal Medicine | Admitting: Internal Medicine

## 2017-09-14 DIAGNOSIS — R1011 Right upper quadrant pain: Secondary | ICD-10-CM | POA: Diagnosis not present

## 2017-09-14 MED ORDER — IOPAMIDOL (ISOVUE-300) INJECTION 61%
100.0000 mL | Freq: Once | INTRAVENOUS | Status: AC | PRN
  Administered 2017-09-14: 100 mL via INTRAVENOUS

## 2017-09-21 DIAGNOSIS — Z1211 Encounter for screening for malignant neoplasm of colon: Secondary | ICD-10-CM | POA: Diagnosis not present

## 2017-09-21 DIAGNOSIS — Z800 Family history of malignant neoplasm of digestive organs: Secondary | ICD-10-CM | POA: Diagnosis not present

## 2017-09-21 DIAGNOSIS — R1011 Right upper quadrant pain: Secondary | ICD-10-CM | POA: Diagnosis not present

## 2017-09-28 DIAGNOSIS — Z1211 Encounter for screening for malignant neoplasm of colon: Secondary | ICD-10-CM | POA: Diagnosis not present

## 2017-09-28 DIAGNOSIS — D126 Benign neoplasm of colon, unspecified: Secondary | ICD-10-CM | POA: Diagnosis not present

## 2017-09-28 DIAGNOSIS — K640 First degree hemorrhoids: Secondary | ICD-10-CM | POA: Diagnosis not present

## 2017-09-28 DIAGNOSIS — Z800 Family history of malignant neoplasm of digestive organs: Secondary | ICD-10-CM | POA: Diagnosis not present

## 2017-10-03 DIAGNOSIS — Z1211 Encounter for screening for malignant neoplasm of colon: Secondary | ICD-10-CM | POA: Diagnosis not present

## 2017-10-03 DIAGNOSIS — D126 Benign neoplasm of colon, unspecified: Secondary | ICD-10-CM | POA: Diagnosis not present

## 2018-03-01 DIAGNOSIS — I10 Essential (primary) hypertension: Secondary | ICD-10-CM | POA: Diagnosis not present

## 2018-03-01 DIAGNOSIS — E559 Vitamin D deficiency, unspecified: Secondary | ICD-10-CM | POA: Diagnosis not present

## 2018-03-01 DIAGNOSIS — E782 Mixed hyperlipidemia: Secondary | ICD-10-CM | POA: Diagnosis not present

## 2018-03-08 DIAGNOSIS — M150 Primary generalized (osteo)arthritis: Secondary | ICD-10-CM | POA: Diagnosis not present

## 2018-03-08 DIAGNOSIS — Z0000 Encounter for general adult medical examination without abnormal findings: Secondary | ICD-10-CM | POA: Diagnosis not present

## 2018-03-08 DIAGNOSIS — I10 Essential (primary) hypertension: Secondary | ICD-10-CM | POA: Diagnosis not present

## 2018-03-08 DIAGNOSIS — E782 Mixed hyperlipidemia: Secondary | ICD-10-CM | POA: Diagnosis not present

## 2018-03-08 DIAGNOSIS — L409 Psoriasis, unspecified: Secondary | ICD-10-CM | POA: Diagnosis not present

## 2018-03-15 DIAGNOSIS — H33301 Unspecified retinal break, right eye: Secondary | ICD-10-CM | POA: Diagnosis not present

## 2018-03-15 DIAGNOSIS — Z961 Presence of intraocular lens: Secondary | ICD-10-CM | POA: Diagnosis not present

## 2018-10-15 DIAGNOSIS — I10 Essential (primary) hypertension: Secondary | ICD-10-CM | POA: Diagnosis not present

## 2018-10-18 DIAGNOSIS — E782 Mixed hyperlipidemia: Secondary | ICD-10-CM | POA: Diagnosis not present

## 2018-10-18 DIAGNOSIS — I10 Essential (primary) hypertension: Secondary | ICD-10-CM | POA: Diagnosis not present

## 2018-10-18 DIAGNOSIS — Z7189 Other specified counseling: Secondary | ICD-10-CM | POA: Diagnosis not present

## 2018-10-18 DIAGNOSIS — R600 Localized edema: Secondary | ICD-10-CM | POA: Diagnosis not present

## 2019-05-30 DIAGNOSIS — Z0000 Encounter for general adult medical examination without abnormal findings: Secondary | ICD-10-CM | POA: Diagnosis not present

## 2019-05-30 DIAGNOSIS — I10 Essential (primary) hypertension: Secondary | ICD-10-CM | POA: Diagnosis not present

## 2019-05-30 DIAGNOSIS — E782 Mixed hyperlipidemia: Secondary | ICD-10-CM | POA: Diagnosis not present

## 2019-06-11 DIAGNOSIS — R600 Localized edema: Secondary | ICD-10-CM | POA: Diagnosis not present

## 2019-06-11 DIAGNOSIS — I10 Essential (primary) hypertension: Secondary | ICD-10-CM | POA: Diagnosis not present

## 2019-06-11 DIAGNOSIS — E782 Mixed hyperlipidemia: Secondary | ICD-10-CM | POA: Diagnosis not present

## 2019-06-11 DIAGNOSIS — L409 Psoriasis, unspecified: Secondary | ICD-10-CM | POA: Diagnosis not present

## 2019-06-11 DIAGNOSIS — E559 Vitamin D deficiency, unspecified: Secondary | ICD-10-CM | POA: Diagnosis not present

## 2019-06-11 DIAGNOSIS — M150 Primary generalized (osteo)arthritis: Secondary | ICD-10-CM | POA: Diagnosis not present

## 2019-06-11 DIAGNOSIS — M8589 Other specified disorders of bone density and structure, multiple sites: Secondary | ICD-10-CM | POA: Diagnosis not present

## 2019-06-18 DIAGNOSIS — N390 Urinary tract infection, site not specified: Secondary | ICD-10-CM | POA: Diagnosis not present

## 2019-06-18 DIAGNOSIS — R350 Frequency of micturition: Secondary | ICD-10-CM | POA: Diagnosis not present

## 2019-10-11 ENCOUNTER — Encounter (HOSPITAL_COMMUNITY): Payer: Self-pay

## 2019-10-11 ENCOUNTER — Other Ambulatory Visit: Payer: Self-pay

## 2019-10-11 ENCOUNTER — Emergency Department (HOSPITAL_COMMUNITY)
Admission: EM | Admit: 2019-10-11 | Discharge: 2019-10-12 | Disposition: A | Discharge status: Home / Self Care | Payer: Medicare Other | Attending: Emergency Medicine | Admitting: Emergency Medicine

## 2019-10-11 DIAGNOSIS — R58 Hemorrhage, not elsewhere classified: Secondary | ICD-10-CM | POA: Insufficient documentation

## 2019-10-11 DIAGNOSIS — Z79899 Other long term (current) drug therapy: Secondary | ICD-10-CM | POA: Insufficient documentation

## 2019-10-11 DIAGNOSIS — N939 Abnormal uterine and vaginal bleeding, unspecified: Secondary | ICD-10-CM | POA: Diagnosis not present

## 2019-10-11 DIAGNOSIS — K625 Hemorrhage of anus and rectum: Secondary | ICD-10-CM | POA: Diagnosis present

## 2019-10-11 DIAGNOSIS — Z7982 Long term (current) use of aspirin: Secondary | ICD-10-CM | POA: Insufficient documentation

## 2019-10-11 DIAGNOSIS — Z853 Personal history of malignant neoplasm of breast: Secondary | ICD-10-CM | POA: Insufficient documentation

## 2019-10-11 DIAGNOSIS — Z780 Asymptomatic menopausal state: Secondary | ICD-10-CM | POA: Diagnosis not present

## 2019-10-11 DIAGNOSIS — N950 Postmenopausal bleeding: Secondary | ICD-10-CM | POA: Diagnosis not present

## 2019-10-11 LAB — COMPREHENSIVE METABOLIC PANEL
ALT: 19 U/L (ref 0–44)
AST: 24 U/L (ref 15–41)
Albumin: 4 g/dL (ref 3.5–5.0)
Alkaline Phosphatase: 84 U/L (ref 38–126)
Anion gap: 13 (ref 5–15)
BUN: 11 mg/dL (ref 8–23)
CO2: 22 mmol/L (ref 22–32)
Calcium: 9.3 mg/dL (ref 8.9–10.3)
Chloride: 106 mmol/L (ref 98–111)
Creatinine, Ser: 0.99 mg/dL (ref 0.44–1.00)
GFR calc Af Amer: >60 mL/min (ref >60)
GFR calc non Af Amer: 56 mL/min — ABNORMAL LOW (ref >60)
Glucose, Bld: 113 mg/dL — ABNORMAL HIGH (ref 70–99)
Potassium: 3.6 mmol/L (ref 3.5–5.1)
Sodium: 141 mmol/L (ref 135–145)
Total Bilirubin: 0.6 mg/dL (ref 0.3–1.2)
Total Protein: 8 g/dL (ref 6.5–8.1)

## 2019-10-11 LAB — CBC
HCT: 46.1 % — ABNORMAL HIGH (ref 36.0–46.0)
Hemoglobin: 14.7 g/dL (ref 12.0–15.0)
MCH: 28.1 pg (ref 26.0–34.0)
MCHC: 31.9 g/dL (ref 30.0–36.0)
MCV: 88 fL (ref 80.0–100.0)
Platelets: 279 10*3/uL (ref 150–400)
RBC: 5.24 MIL/uL — ABNORMAL HIGH (ref 3.87–5.11)
RDW: 12.8 % (ref 11.5–15.5)
WBC: 4.8 10*3/uL (ref 4.0–10.5)
nRBC: 0 % (ref 0.0–0.2)

## 2019-10-11 LAB — TYPE AND SCREEN
ABO/RH(D): O NEG
Antibody Screen: NEGATIVE

## 2019-10-11 NOTE — ED Triage Notes (Signed)
Patient complains of bleeding that started this am. States unable to distinguish if from vagina or rectum, no pain. Alert and oriented

## 2019-10-11 NOTE — ED Notes (Signed)
No answer in lobby.  Was told pt is in restroom.

## 2019-10-12 ENCOUNTER — Encounter (HOSPITAL_BASED_OUTPATIENT_CLINIC_OR_DEPARTMENT_OTHER): Payer: Self-pay | Admitting: Emergency Medicine

## 2019-10-12 ENCOUNTER — Emergency Department (HOSPITAL_BASED_OUTPATIENT_CLINIC_OR_DEPARTMENT_OTHER)
Admission: EM | Admit: 2019-10-12 | Discharge: 2019-10-12 | Disposition: A | Discharge status: Home / Self Care | Payer: Medicare Other | Source: Home / Self Care | Attending: Emergency Medicine | Admitting: Emergency Medicine

## 2019-10-12 ENCOUNTER — Emergency Department (HOSPITAL_BASED_OUTPATIENT_CLINIC_OR_DEPARTMENT_OTHER): Payer: Medicare Other

## 2019-10-12 ENCOUNTER — Other Ambulatory Visit: Payer: Self-pay

## 2019-10-12 DIAGNOSIS — Z79899 Other long term (current) drug therapy: Secondary | ICD-10-CM | POA: Insufficient documentation

## 2019-10-12 DIAGNOSIS — Z853 Personal history of malignant neoplasm of breast: Secondary | ICD-10-CM | POA: Insufficient documentation

## 2019-10-12 DIAGNOSIS — N939 Abnormal uterine and vaginal bleeding, unspecified: Secondary | ICD-10-CM

## 2019-10-12 DIAGNOSIS — N950 Postmenopausal bleeding: Secondary | ICD-10-CM | POA: Diagnosis not present

## 2019-10-12 DIAGNOSIS — Z780 Asymptomatic menopausal state: Secondary | ICD-10-CM | POA: Diagnosis not present

## 2019-10-12 DIAGNOSIS — Z7982 Long term (current) use of aspirin: Secondary | ICD-10-CM | POA: Insufficient documentation

## 2019-10-12 DIAGNOSIS — R58 Hemorrhage, not elsewhere classified: Secondary | ICD-10-CM | POA: Diagnosis not present

## 2019-10-12 LAB — URINALYSIS, ROUTINE W REFLEX MICROSCOPIC
Bilirubin Urine: NEGATIVE
Glucose, UA: NEGATIVE mg/dL
Ketones, ur: 15 mg/dL — AB
Leukocytes,Ua: NEGATIVE
Nitrite: NEGATIVE
Protein, ur: NEGATIVE mg/dL
Specific Gravity, Urine: 1.025 (ref 1.005–1.030)
pH: 5.5 (ref 5.0–8.0)

## 2019-10-12 LAB — CBC
HCT: 44.7 % (ref 36.0–46.0)
Hemoglobin: 14.6 g/dL (ref 12.0–15.0)
MCH: 28.3 pg (ref 26.0–34.0)
MCHC: 32.7 g/dL (ref 30.0–36.0)
MCV: 86.8 fL (ref 80.0–100.0)
Platelets: 281 10*3/uL (ref 150–400)
RBC: 5.15 MIL/uL — ABNORMAL HIGH (ref 3.87–5.11)
RDW: 13.1 % (ref 11.5–15.5)
WBC: 5.4 10*3/uL (ref 4.0–10.5)
nRBC: 0 % (ref 0.0–0.2)

## 2019-10-12 LAB — URINALYSIS, MICROSCOPIC (REFLEX)

## 2019-10-12 NOTE — ED Notes (Signed)
Pt stated she was going to sit outside

## 2019-10-12 NOTE — ED Notes (Signed)
Pt told this NT that she was going to go to [facility].

## 2019-10-12 NOTE — ED Triage Notes (Signed)
Pt states she was watching tv around 1300 yesterday and started having some bleeding. She states she thinks it is coming from her vagina but she is unsure. Also endorses RLQ pain and R flank pain. Rates pain 4/10 now, states it was worse earlier. Pt states that her bleeding was initially "a lot" but has "tapered off some". States she is still bleeding. Denies lightheadedness or SHOB now.

## 2019-10-12 NOTE — ED Provider Notes (Signed)
[facility] EMERGENCY DEPARTMENT Provider Note   CSN: [id number] Arrival date & time: 10/12/19  6073     History Chief Complaint  Patient presents with  . Vaginal Bleeding    Tiffany Gilmore is a 75 y.o. female.  74 yo F with a chief complaint of vaginal bleeding.  This is been going on since yesterday about 1 PM.  She went to the [facility] emergency department but unfortunately they are experiencing 19-hour weights and so she left and came here.  She feels that the bleeding has improved somewhat.  She felt like she had some lower abdominal cramping that has resolved.  No fevers no vomiting.  She is unsure exactly if the source of the bleeding she thinks it is her vagina but says it could be her rectum.  Denies blood in her urine.  The history is provided by the patient.  Vaginal Bleeding Quality:  Bright red Severity:  Moderate Onset quality:  Gradual Duration:  2 days Timing:  Constant Progression:  Partially resolved Chronicity:  New Possible pregnancy: no   Relieved by:  Nothing Worsened by:  Nothing Ineffective treatments:  None tried Associated symptoms: no dizziness, no dysuria, no fever and no nausea        Past Medical History:  Diagnosis Date  . Arthritis   . Cancer ([place])    breast R  . Cataract     Patient Active Problem List   Diagnosis Date Noted  . COLONIC POLYPS 06/18/2007  . GERD 06/18/2007  . RECTAL BLEEDING 06/18/2007  . UNSPECIFIED DISORDER OF LIVER 06/18/2007    Past Surgical History:  Procedure Laterality Date  . BREAST SURGERY    . EYE SURGERY       OB History   No obstetric history on file.     No family history on file.  Social History   Tobacco Use  . Smoking status: Never Smoker  . Smokeless tobacco: Never Used  Substance Use Topics  . Alcohol use: No  . Drug use: No    Home Medications Prior to Admission medications   Medication Sig Start Date End Date Taking? Authorizing Provider  amLODipine (NORVASC) 5  MG tablet Take 5 mg by mouth daily. 08/21/19   [provider]  aspirin 81 MG tablet Take 81 mg by mouth daily.    [provider]  fish oil-omega-3 fatty acids 1000 MG capsule Take 2 g by mouth daily.    [provider]  HYDROcodone-acetaminophen (NORCO) 5-325 MG per tablet Take 1 tablet by mouth every 6 (six) hours as needed for pain. 04/29/12   Robyn Haber, MD  predniSONE (DELTASONE) 20 MG tablet Two daily with food 04/29/12   Robyn Haber, MD    Allergies    Aspirin and Sulfa antibiotics  Review of Systems   Review of Systems  Constitutional: Negative for chills and fever.  HENT: Negative for congestion and rhinorrhea.   Eyes: Negative for redness and visual disturbance.  Respiratory: Negative for shortness of breath and wheezing.   Cardiovascular: Negative for chest pain and palpitations.  Gastrointestinal: Negative for nausea and vomiting.  Genitourinary: Positive for vaginal bleeding. Negative for dysuria and urgency.  Musculoskeletal: Negative for arthralgias and myalgias.  Skin: Negative for pallor and wound.  Neurological: Negative for dizziness and headaches.    Physical Exam Updated Vital Signs BP (!) 157/87 (BP Location: Left Arm)   Pulse 87   Temp 97.7 F (36.5 C) (Oral)   Resp 20  Ht 4\' 11"  (1.499 m)   Wt 97.5 kg   SpO2 100%   BMI 43.42 kg/m   Physical Exam Vitals and nursing note reviewed.  Constitutional:      General: She is not in acute distress.    Appearance: She is well-developed. She is not diaphoretic.  HENT:     Head: Normocephalic and atraumatic.  Eyes:     Pupils: Pupils are equal, round, and reactive to light.  Cardiovascular:     Rate and Rhythm: Normal rate and regular rhythm.     Heart sounds: No murmur heard.  No friction rub. No gallop.   Pulmonary:     Effort: Pulmonary effort is normal.     Breath sounds: No wheezing or rales.  Abdominal:     General: There is no distension.     Palpations:  Abdomen is soft.     Tenderness: There is no abdominal tenderness.  Genitourinary:    Comments: Blood noted at the introitus.  There are no tears.  2 small hemorrhoids without bleeding.  No blood in the rectal vault. Musculoskeletal:        General: No tenderness.     Cervical back: Normal range of motion and neck supple.  Skin:    General: Skin is warm and dry.  Neurological:     Mental Status: She is alert and oriented to person, place, and time.  Psychiatric:        Behavior: Behavior normal.     ED Results / Procedures / Treatments   Labs (all labs ordered are listed, but only abnormal results are displayed) Labs Reviewed  URINALYSIS, ROUTINE W REFLEX MICROSCOPIC - Abnormal; Notable for the following components:      Result Value   Hgb urine dipstick LARGE (*)    Ketones, ur 15 (*)    All other components within normal limits  CBC - Abnormal; Notable for the following components:   RBC 5.15 (*)    All other components within normal limits  URINALYSIS, MICROSCOPIC (REFLEX) - Abnormal; Notable for the following components:   Bacteria, UA MANY (*)    All other components within normal limits    EKG None  Radiology US PELVIC COMPLETE WITH TRANSVAGINAL  Result Date: 10/12/2019 CLINICAL DATA:  74 year old presenting postmenopausal vaginal bleeding associated with RIGHT LOWER QUADRANT abdominal pain/RIGHT-sided pelvic pain that began yesterday. Personal history of breast cancer in 1986. EXAM: TRANSABDOMINAL AND TRANSVAGINAL ULTRASOUND OF PELVIS TECHNIQUE: Both transabdominal and transvaginal ultrasound examinations of the pelvis were performed. Transabdominal technique was performed for global imaging of the pelvis including uterus, ovaries, adnexal regions, and pelvic cul-de-sac. It was necessary to proceed with endovaginal exam following the transabdominal exam to visualize the uterus and ovaries due to incomplete bladder distension and abundant bowel gas in the pelvis.  COMPARISON:  None FINDINGS: Uterus Measurements: Approximately 7.6 x 3.8 x 5.0 cm = volume: 75.2 mL. Dilated parametrial vessels. Nabothian cysts on the cervix. Endometrium Thickness: Approximately 17 mm.  No endometrial fluid. Right ovary Not visualized either transabdominally or transvaginally. Left ovary Not visualized either transabdominally or transvaginally. Other findings No abnormal free fluid. IMPRESSION: 1. Abnormally thickened endometrium measuring up to 17 mm. In the setting of post-menopausal bleeding, endometrial sampling is indicated to exclude carcinoma. If results are benign, sonohysterogram should be considered for focal lesion work-up. (Ref: Radiological Reasoning: Algorithmic Workup of Abnormal Vaginal Bleeding with Endovaginal Sonography and Sonohysterography. AJR 2008; 258:N27-78) 2. Nonvisualization of the ovaries. 3. Dilated parametrial vessels, which  can be seen in patients with pelvic congestion syndrome. Electronically Signed   By: Evangeline Dakin M.D.   On: 10/12/2019 09:44    Procedures Procedures (including critical care time)  Medications Ordered in ED Medications - No data to display  ED Course  I have reviewed the triage vital signs and the nursing notes.  Pertinent labs & imaging results that were available during my care of the patient were reviewed by me and considered in my medical decision making (see chart for details).    MDM Rules/Calculators/A&P                          74 yo F with a chief complaints of vaginal bleeding.  Going on since yesterday.  Had blood work obtained today at the [facility], ED hemoglobin 14.  We will recheck her hemoglobin here.  Pelvic ultrasound.  Repeat hemoglobin here without change.  UA without infection.  Thickened endometrium on ultrasound.  I discussed the results with the patient and encouraged her to follow-up with GYN for a possible endometrial biopsy.  She does not have a current provider and so we will give her  referral to the women's center.  11:22 AM:  I have discussed the diagnosis/risks/treatment options with the patient and believe the pt to be eligible for discharge home to follow-up with GYN. We also discussed returning to the ED immediately if new or worsening sx occur. We discussed the sx which are most concerning (e.g., sudden worsening pain, fever, inability to tolerate by mouth) that necessitate immediate return. Medications administered to the patient during their visit and any new prescriptions provided to the patient are listed below.  Medications given during this visit Medications - No data to display   The patient appears reasonably screen and/or stabilized for discharge and I doubt any other medical condition or other [facility] requiring further screening, evaluation, or treatment in the ED at this time prior to discharge.   Final Clinical Impression(s) / ED Diagnoses Final diagnoses:  Vaginal bleeding    Rx / DC Orders ED Discharge Orders    None       Deno Etienne, DO 10/12/19 1122

## 2019-10-12 NOTE — Discharge Instructions (Signed)
You need to follow-up with OB/GYN specialist in the office.  I have attached the women's clinic that is associated with our hospital system.  If you have an OB/GYN then please call them and set up an appointment if you would like to follow-up through our system then call the number provided.  You have a thickened endometrium which is abnormal for someone your age.  Most commonly this would require a biopsy.

## 2019-10-22 DIAGNOSIS — C541 Malignant neoplasm of endometrium: Secondary | ICD-10-CM | POA: Diagnosis not present

## 2019-10-22 DIAGNOSIS — N950 Postmenopausal bleeding: Secondary | ICD-10-CM | POA: Diagnosis not present

## 2019-10-27 ENCOUNTER — Telehealth: Payer: Self-pay | Admitting: *Deleted

## 2019-10-27 NOTE — Telephone Encounter (Signed)
Called and spoke with the patient; scheduled a new patient appt for 9/29 at 10:30 am. Gave the address and phone number for the clinic. Also gave the policy for visitors and masks

## 2019-10-29 ENCOUNTER — Other Ambulatory Visit: Payer: Self-pay

## 2019-10-29 ENCOUNTER — Encounter: Payer: Self-pay | Admitting: Gynecologic Oncology

## 2019-10-29 ENCOUNTER — Inpatient Hospital Stay: Payer: Medicare Other | Attending: Gynecologic Oncology | Admitting: Gynecologic Oncology

## 2019-10-29 VITALS — BP 150/56 | HR 90 | Temp 97.2°F | Resp 16 | Ht 59.0 in | Wt 205.4 lb

## 2019-10-29 DIAGNOSIS — E7800 Pure hypercholesterolemia, unspecified: Secondary | ICD-10-CM | POA: Diagnosis not present

## 2019-10-29 DIAGNOSIS — Z79899 Other long term (current) drug therapy: Secondary | ICD-10-CM | POA: Insufficient documentation

## 2019-10-29 DIAGNOSIS — Z9011 Acquired absence of right breast and nipple: Secondary | ICD-10-CM | POA: Diagnosis not present

## 2019-10-29 DIAGNOSIS — I10 Essential (primary) hypertension: Secondary | ICD-10-CM | POA: Diagnosis not present

## 2019-10-29 DIAGNOSIS — Z6841 Body Mass Index (BMI) 40.0 and over, adult: Secondary | ICD-10-CM | POA: Insufficient documentation

## 2019-10-29 DIAGNOSIS — Z853 Personal history of malignant neoplasm of breast: Secondary | ICD-10-CM | POA: Diagnosis not present

## 2019-10-29 DIAGNOSIS — M1990 Unspecified osteoarthritis, unspecified site: Secondary | ICD-10-CM | POA: Diagnosis not present

## 2019-10-29 DIAGNOSIS — C541 Malignant neoplasm of endometrium: Secondary | ICD-10-CM | POA: Diagnosis not present

## 2019-10-29 DIAGNOSIS — Z7982 Long term (current) use of aspirin: Secondary | ICD-10-CM | POA: Diagnosis not present

## 2019-10-29 NOTE — H&P (View-Only) (Signed)
Consult Note: Gyn-Onc  Consult was requested by Dr. Wilhelmenia Blase for the evaluation of Tiffany Gilmore 74 y.o. female  CC:  Chief Complaint  Patient presents with  . Endometrial adenocarcinoma    New patient     Assessment/Plan:  Tiffany Gilmore  is a 74 y.o.  year old P3 with grade 1 endometrial cancer.  A detailed discussion was held with the patient with regard to to her endometrial cancer diagnosis. We discussed the standard management options for uterine cancer which includes surgery followed possibly by adjuvant therapy depending on the results of surgery. The surgical management include a robotic assisted total hysterectomy and removal of the tubes and ovaries with sampling of lymph nodes. If a minimally invasive approach is not feasible, a laparotomy may be necessary (including for specimen delivery). The patient has been counseled about these surgical options and the risks of surgery in general including infection, bleeding, damage to surrounding structures (including bowel, bladder, ureters, nerves or vessels), and the postoperative risks of PE/ DVT, and lymphedema. After counseling and consideration of her options, she is in agreement to proceed with robotic assisted total hysterectomy with bilateral sapingo-oophorectomy and SLN biopsy.   Tiffany Gilmore is particularly petite in stature at 5 foot 68, with a BMI of 42 kg per metered squared, and a protuberant abdomen, all of which place her at increased risk for surgical complications due to visceral crowding and poor visualization which is inherent.  I explained this to the patient.  It also places her at increased risk for difficulty with ventilation and positioning during the surgery, which could result in Tiffany Gilmore needing to abort the procedure.  For this reason is most appropriate that her surgery is performed in the main OR setting.  She will be seen by anesthesia for preoperative clearance and discussion of postoperative pain management.  She was  given the opportunity to ask questions, which were answered to her satisfaction, and she is agreement with the above mentioned plan of care.   We explained that robotic hysterectomy is typically an outpatient procedure with same day discharge provided that she is meeting appropriate discharge criteria from the PACU. We provided extensive counseling regarding post-operative expectations for recovery and restrictions/limitations. We provided information regarding multi-modal pain therapy and the importance of avoidance of opioids. I informed the patient that I anticipated a 1-2 week recovery time off of work as she is self-employed at a desk job.  We explained that after surgery we will review her definitive pathology and determine if adjuvant therapy is recommended.   HPI: Tiffany Gilmore is a 74 year old P3 who was seen in consultation at the request of Dr Wilhelmenia Blase for evaluation and treatment of grade 1 endometrial cancer.   Her symptoms began in approximately 2020 with postmenopausal spotting.  On October 11, 2019 she developed heavier vaginal bleeding which was alarming and a change noted by the patient and therefore she presented to the emergency department on that same day.  The emergency department performed a transvaginal ultrasound scan on October 12, 2019 which revealed a uterus measuring 7.6 x 3.8 x 5 cm with an endometrial thickness of 17 mm.  The right and left ovaries were not visualized..  She did not have a gynecologist with whom to follow-up and therefore was scheduled to see Dr Wilhelmenia Blase for a pelvic examination on October 22, 2019.  Work-up of symptoms included an endometrial biopsy. Endometrial sampling with Pipelle in the office was performed on 10/22/2019 and showed  endometrioid adenocarcinoma with squamous differentiation, FIGO grade 1.   The patient's medical history is significant for morbid obesity with a BMI of 42 kg meters squared.  She is 4 foot 11.  Her medical history is also  significant for a personal history of breast cancer in her 62s.  This was treated with mastectomy and axillary node dissection (right).  She did not receive adjuvant therapy.  She was followed and had no recurrence.  She denied having genetic testing offered or performed.  Additionally she reported a history of hypertension, hypercholesterolemia, and arthritis.    She has had 2 prior cesarean sections but no other abdominal surgeries she is also had 1 vaginal delivery.  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   Current Meds:  Outpatient Encounter Medications as of 10/29/2019  Medication Sig  . amLODipine (NORVASC) 5 MG tablet Take 5 mg by mouth daily.  Marland Kitchen aspirin 81 MG tablet Take 81 mg by mouth daily.  . fish oil-omega-3 fatty acids 1000 MG capsule Take 2 g by mouth daily.  . [DISCONTINUED] HYDROcodone-acetaminophen (NORCO) 5-325 MG per tablet Take 1 tablet by mouth every 6 (six) hours as needed for pain.  . [DISCONTINUED] predniSONE (DELTASONE) 20 MG tablet Two daily with food   No facility-administered encounter medications on file as of 10/29/2019.    Allergy:  Allergies  Allergen Reactions  . Aspirin     REACTION: upsets stomach at times  . Sulfa Antibiotics Rash    Social Hx:   Social History   Socioeconomic History  . Marital status: Married    Spouse name: Not on file  . Number of children: Not on file  . Years of education: Not on file  . Highest education level: Not on file  Occupational History  . Not on file  Tobacco Use  . Smoking status: Never Smoker  . Smokeless tobacco: Never Used  Substance and Sexual Activity  . Alcohol use: No  . Drug use: No  . Sexual activity: Yes  Other Topics Concern  . Not on file  Social History Narrative  . Not on file   Social Determinants of Health   Financial Resource Strain:   . Difficulty of Paying Living Expenses: Not on file  Food Insecurity:   . Worried About Charity fundraiser in the  Last Year: Not on file  . Ran Out of Food in the Last Year: Not on file  Transportation Needs:   . Lack of Transportation (Medical): Not on file  . Lack of Transportation (Non-Medical): Not on file  Physical Activity:   . Days of Exercise per Week: Not on file  . Minutes of Exercise per Session: Not on file  Stress:   . Feeling of Stress : Not on file  Social Connections:   . Frequency of Communication with Friends and Family: Not on file  . Frequency of Social Gatherings with Friends and Family: Not on file  . Attends Religious Services: Not on file  . Active Member of Clubs or Organizations: Not on file  . Attends Archivist Meetings: Not on file  . Marital Status: Not on file  Intimate Partner Violence:   . Fear of Current or Ex-Partner: Not on file  . Emotionally Abused: Not on file  . Physically Abused: Not on file  . Sexually Abused: Not on file    Past Surgical Hx:  Past Surgical History:  Procedure Laterality Date  . BREAST SURGERY    .  EYE SURGERY      Past Medical Hx:  Past Medical History:  Diagnosis Date  . Arthritis   . Cancer ([place])    breast R  . Cataract   . Endometrial cancer [facility])     Past Gynecological History:  See HPI No LMP recorded. Patient is postmenopausal.  Family Hx: History reviewed. No pertinent family history.  Review of Systems:  Constitutional  Feels well,   ENT Normal appearing ears and nares bilaterally Skin/Breast  No rash, sores, jaundice, itching, dryness Cardiovascular  No chest pain, shortness of breath, or edema  Pulmonary  No cough or wheeze.  Gastro Intestinal  No nausea, vomitting, or diarrhoea. No bright red blood per rectum, no abdominal pain, change in bowel movement, or constipation.  Genito Urinary  No frequency, urgency, dysuria, + postmenopausal bleeding Musculo Skeletal  No myalgia, arthralgia, joint swelling or pain  Neurologic  No weakness, numbness, change in gait,  Psychology  No  depression, anxiety, insomnia.   Vitals:  Blood pressure (!) 150/56, pulse 90, temperature (!) 97.2 F (36.2 C), temperature source Tympanic, resp. rate 16, height 4\' 11"  (1.499 m), weight 205 lb 6.4 oz (93.2 kg), SpO2 100 %.  Physical Exam: WD in NAD Neck  Supple NROM, without any enlargements.  Lymph Node Survey No cervical supraclavicular or inguinal adenopathy Cardiovascular  Pulse normal rate, regularity and rhythm. S1 and S2 normal.  Lungs  Clear to auscultation bilateraly, without wheezes/crackles/rhonchi. Good air movement.  Skin  No rash/lesions/breakdown  Psychiatry  Alert and oriented to person, place, and time  Abdomen  Normoactive bowel sounds, abdomen soft, non-tender and obese without evidence of hernia. Vertical midline incision. Short waisted, protuberant abdomen. Back No CVA tenderness Genito Urinary  Vulva/vagina: Normal external female genitalia.  No lesions. No discharge or bleeding.  Bladder/urethra:  No lesions or masses, well supported bladder  Vagina: grossly normal  Cervix: Normal appearing, no lesions.  Uterus:  Small, mobile, no parametrial involvement or nodularity.  Adnexa: no palpable masses. Rectal  deferred Extremities  No bilateral cyanosis, clubbing or edema.  60 minutes of total time was spent for this patient encounter, including preparation, face-to-face counseling with the patient and coordination of care, and documentation of the encounter.   Thereasa Solo, MD  10/29/2019, 11:15 AM

## 2019-10-29 NOTE — Progress Notes (Signed)
Consult Note: Gyn-Onc  Consult was requested by Dr. Wilhelmenia Blase for the evaluation of Tiffany Gilmore 74 y.o. female  CC:  Chief Complaint  Patient presents with  . Endometrial adenocarcinoma    New patient     Assessment/Plan:  Tiffany Gilmore  is a 74 y.o.  year old P3 with grade 1 endometrial cancer.  A detailed discussion was held with the patient with regard to to her endometrial cancer diagnosis. We discussed the standard management options for uterine cancer which includes surgery followed possibly by adjuvant therapy depending on the results of surgery. The surgical management include a robotic assisted total hysterectomy and removal of the tubes and ovaries with sampling of lymph nodes. If a minimally invasive approach is not feasible, a laparotomy may be necessary (including for specimen delivery). The patient has been counseled about these surgical options and the risks of surgery in general including infection, bleeding, damage to surrounding structures (including bowel, bladder, ureters, nerves or vessels), and the postoperative risks of PE/ DVT, and lymphedema. After counseling and consideration of her options, she is in agreement to proceed with robotic assisted total hysterectomy with bilateral sapingo-oophorectomy and SLN biopsy.   Tiffany Gilmore is particularly petite in stature at 5 foot 92, with a BMI of 42 kg per metered squared, and a protuberant abdomen, all of which place her at increased risk for surgical complications due to visceral crowding and poor visualization which is inherent.  I explained this to the patient.  It also places her at increased risk for difficulty with ventilation and positioning during the surgery, which could result in [place] needing to abort the procedure.  For this reason is most appropriate that her surgery is performed in the main OR setting.  She will be seen by anesthesia for preoperative clearance and discussion of postoperative pain management.  She was  given the opportunity to ask questions, which were answered to her satisfaction, and she is agreement with the above mentioned plan of care.   We explained that robotic hysterectomy is typically an outpatient procedure with same day discharge provided that she is meeting appropriate discharge criteria from the PACU. We provided extensive counseling regarding post-operative expectations for recovery and restrictions/limitations. We provided information regarding multi-modal pain therapy and the importance of avoidance of opioids. I informed the patient that I anticipated a 1-2 week recovery time off of work as she is self-employed at a desk job.  We explained that after surgery we will review her definitive pathology and determine if adjuvant therapy is recommended.   HPI: Tiffany Gilmore is a 74 year old P3 who was seen in consultation at the request of Dr Wilhelmenia Blase for evaluation and treatment of grade 1 endometrial cancer.   Her symptoms began in approximately 2020 with postmenopausal spotting.  On October 11, 2019 she developed heavier vaginal bleeding which was alarming and a change noted by the patient and therefore she presented to the emergency department on that same day.  The emergency department performed a transvaginal ultrasound scan on October 12, 2019 which revealed a uterus measuring 7.6 x 3.8 x 5 cm with an endometrial thickness of 17 mm.  The right and left ovaries were not visualized..  She did not have a gynecologist with whom to follow-up and therefore was scheduled to see Dr Wilhelmenia Blase for a pelvic examination on October 22, 2019.  Work-up of symptoms included an endometrial biopsy. Endometrial sampling with Pipelle in the office was performed on 10/22/2019 and showed  endometrioid adenocarcinoma with squamous differentiation, FIGO grade 1.   The patient's medical history is significant for morbid obesity with a BMI of 42 kg meters squared.  She is 4 foot 11.  Her medical history is also  significant for a personal history of breast cancer in her 24s.  This was treated with mastectomy and axillary node dissection (right).  She did not receive adjuvant therapy.  She was followed and had no recurrence.  She denied having genetic testing offered or performed.  Additionally she reported a history of hypertension, hypercholesterolemia, and arthritis.    She has had 2 prior cesarean sections but no other abdominal surgeries she is also had 1 vaginal delivery.  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   Current Meds:  Outpatient Encounter Medications as of 10/29/2019  Medication Sig  . amLODipine (NORVASC) 5 MG tablet Take 5 mg by mouth daily.  Marland Kitchen aspirin 81 MG tablet Take 81 mg by mouth daily.  . fish oil-omega-3 fatty acids 1000 MG capsule Take 2 g by mouth daily.  . [DISCONTINUED] HYDROcodone-acetaminophen (NORCO) 5-325 MG per tablet Take 1 tablet by mouth every 6 (six) hours as needed for pain.  . [DISCONTINUED] predniSONE (DELTASONE) 20 MG tablet Two daily with food   No facility-administered encounter medications on file as of 10/29/2019.    Allergy:  Allergies  Allergen Reactions  . Aspirin     REACTION: upsets stomach at times  . Sulfa Antibiotics Rash    Social Hx:   Social History   Socioeconomic History  . Marital status: Married    Spouse name: Not on file  . Number of children: Not on file  . Years of education: Not on file  . Highest education level: Not on file  Occupational History  . Not on file  Tobacco Use  . Smoking status: Never Smoker  . Smokeless tobacco: Never Used  Substance and Sexual Activity  . Alcohol use: No  . Drug use: No  . Sexual activity: Yes  Other Topics Concern  . Not on file  Social History Narrative  . Not on file   Social Determinants of Health   Financial Resource Strain:   . Difficulty of Paying Living Expenses: Not on file  Food Insecurity:   . Worried About Charity fundraiser in the  Last Year: Not on file  . Ran Out of Food in the Last Year: Not on file  Transportation Needs:   . Lack of Transportation (Medical): Not on file  . Lack of Transportation (Non-Medical): Not on file  Physical Activity:   . Days of Exercise per Week: Not on file  . Minutes of Exercise per Session: Not on file  Stress:   . Feeling of Stress : Not on file  Social Connections:   . Frequency of Communication with Friends and Family: Not on file  . Frequency of Social Gatherings with Friends and Family: Not on file  . Attends Religious Services: Not on file  . Active Member of Clubs or Organizations: Not on file  . Attends Archivist Meetings: Not on file  . Marital Status: Not on file  Intimate Partner Violence:   . Fear of Current or Ex-Partner: Not on file  . Emotionally Abused: Not on file  . Physically Abused: Not on file  . Sexually Abused: Not on file    Past Surgical Hx:  Past Surgical History:  Procedure Laterality Date  . BREAST SURGERY    .  EYE SURGERY      Past Medical Hx:  Past Medical History:  Diagnosis Date  . Arthritis   . Cancer ([place])    breast R  . Cataract   . Endometrial cancer [facility])     Past Gynecological History:  See HPI No LMP recorded. Patient is postmenopausal.  Family Hx: History reviewed. No pertinent family history.  Review of Systems:  Constitutional  Feels well,   ENT Normal appearing ears and nares bilaterally Skin/Breast  No rash, sores, jaundice, itching, dryness Cardiovascular  No chest pain, shortness of breath, or edema  Pulmonary  No cough or wheeze.  Gastro Intestinal  No nausea, vomitting, or diarrhoea. No bright red blood per rectum, no abdominal pain, change in bowel movement, or constipation.  Genito Urinary  No frequency, urgency, dysuria, + postmenopausal bleeding Musculo Skeletal  No myalgia, arthralgia, joint swelling or pain  Neurologic  No weakness, numbness, change in gait,  Psychology  No  depression, anxiety, insomnia.   Vitals:  Blood pressure (!) 150/56, pulse 90, temperature (!) 97.2 F (36.2 C), temperature source Tympanic, resp. rate 16, height 4\' 11"  (1.499 m), weight 205 lb 6.4 oz (93.2 kg), SpO2 100 %.  Physical Exam: WD in NAD Neck  Supple NROM, without any enlargements.  Lymph Node Survey No cervical supraclavicular or inguinal adenopathy Cardiovascular  Pulse normal rate, regularity and rhythm. S1 and S2 normal.  Lungs  Clear to auscultation bilateraly, without wheezes/crackles/rhonchi. Good air movement.  Skin  No rash/lesions/breakdown  Psychiatry  Alert and oriented to person, place, and time  Abdomen  Normoactive bowel sounds, abdomen soft, non-tender and obese without evidence of hernia. Vertical midline incision. Short waisted, protuberant abdomen. Back No CVA tenderness Genito Urinary  Vulva/vagina: Normal external female genitalia.  No lesions. No discharge or bleeding.  Bladder/urethra:  No lesions or masses, well supported bladder  Vagina: grossly normal  Cervix: Normal appearing, no lesions.  Uterus:  Small, mobile, no parametrial involvement or nodularity.  Adnexa: no palpable masses. Rectal  deferred Extremities  No bilateral cyanosis, clubbing or edema.  60 minutes of total time was spent for this patient encounter, including preparation, face-to-face counseling with the patient and coordination of care, and documentation of the encounter.   Thereasa Solo, MD  10/29/2019, 11:15 AM

## 2019-10-29 NOTE — Patient Instructions (Signed)
Dr Denman George is recommending a robotic hysterectomy with removal of both tubes and ovaries and sentinel lymph node biopsy for your endometrial cancer. This will be performed on 11/18/19.  You will have a preop visit scheduled with nurse practitioner, Joylene John, to discuss the surgery in more detail and the anticipated postoperative recovery and restrictions etc.  If your daughter has questions regarding the procedure and diagnosis, she should contact our nurse practitioner Joylene John at [phone].

## 2019-10-30 ENCOUNTER — Telehealth: Payer: Self-pay

## 2019-10-30 NOTE — Telephone Encounter (Signed)
Told Tiffany Gilmore that Tiffany John, NP wants her to stop the fish oil now until after her surgery. It can make the platelets less sticky and with the surgery we want her platelets work well.  Tiffany Gilmore states hat the ASA 81 mg tablet is for preventive heart health. We will call her back to let her know if Tiffany Gilmore wants her to stop the asa prior to surgery or not. Pt verbalized understanding.

## 2019-10-31 NOTE — Telephone Encounter (Signed)
Told Tiffany Gilmore that Dr. Denman George wants her to stop the asprin and fish oil now. Pt verbalized understanding.

## 2019-11-07 NOTE — Patient Instructions (Addendum)
DUE TO COVID-19 ONLY ONE VISITOR IS ALLOWED TO COME WITH YOU AND STAY IN THE WAITING ROOM ONLY  DURING PRE OP AND PROCEDURE.   IF YOU WILL BE ADMITTED INTO THE HOSPITAL YOU ARE ALLOWED ONE SUPPORT PERSON DURING  VISITATION HOURS ONLY (10AM -8PM)   . The support person may change daily. . The support person must pass our screening, gel in and out, and wear a mask at all times, including in the patient's room. . Patients must also wear a mask when staff or their support person are in the room.   COVID SWAB TESTING MUST BE COMPLETED ON:  Friday, 11-14-19 @ 2:45 PM   [address] [place], [place] [address]  (Must self quarantine after testing. Follow instructions on  handout.)         Your procedure is scheduled on:  Tuesday, 11-18-19   Report to [facility] Main  Entrance   Report to Short Stay at 5:30 AM   [facility])    Call this number if you have problems the morning of surgery [phone]    Light diet day before surgery (avoid gas producing foods)   Do not eat food :After Midnight.   May have liquids until 4:30 AM day of surgery  CLEAR LIQUID DIET  Foods Allowed                                                                     Foods Excluded  Water, Black Coffee and tea, regular and decaf                liquids that you cannot  Plain Jell-O in any flavor  (No red)                                       see through such as: Fruit ices (not with fruit pulp)                                      milk, soups, orange juice              Iced Popsicles (No red)                                      All solid food                                   Apple juices Sports drinks like Gatorade (No red) Lightly seasoned clear broth or consume(fat free) Sugar, honey syrup     Oral Hygiene is also important to reduce your risk of infection.                                    Remember - BRUSH YOUR TEETH THE MORNING OF SURGERY WITH YOUR REGULAR TOOTHPASTE   Do NOT smoke  after  Midnight   Take these medicines the morning of surgery with A SIP OF WATER:  Amlodipine                                You may not have any metal on your body including hair pins, jewelry, and body piercings             Do not wear make-up, lotions, powders, perfumes/cologne, or deodorant             Do not wear nail polish.  Do not shave  48 hours prior to surgery.     Do not bring valuables to the hospital. [place].   Contacts, dentures or bridgework may not be worn into surgery.     Patients discharged the day of surgery will not be allowed to drive home.                Please read over the following fact sheets you were given: IF YOU HAVE QUESTIONS ABOUT YOUR PRE OP INSTRUCTIONS PLEASE CALL [phone]   [place] - Preparing for Surgery Before surgery, you can play an important role.  Because skin is not sterile, your skin needs to be as free of germs as possible.  You can reduce the number of germs on your skin by washing with CHG (chlorahexidine gluconate) soap before surgery.  CHG is an antiseptic cleaner which kills germs and bonds with the skin to continue killing germs even after washing. Please DO NOT use if you have an allergy to CHG or antibacterial soaps.  If your skin becomes reddened/irritated stop using the CHG and inform your nurse when you arrive at Short Stay. Do not shave (including legs and underarms) for at least 48 hours prior to the first CHG shower.  You may shave your face/neck.  Please follow these instructions carefully:  1.  Shower with CHG Soap the night before surgery and the  morning of surgery.  2.  If you choose to wash your hair, wash your hair first as usual with your normal  shampoo.  3.  After you shampoo, rinse your hair and body thoroughly to remove the shampoo.                             4.  Use CHG as you would any other liquid soap.  You can apply chg directly to the skin and wash.  Gently  with a scrungie or clean washcloth.  5.  Apply the CHG Soap to your body ONLY FROM THE NECK DOWN.   Do   not use on face/ open                           Wound or open sores. Avoid contact with eyes, ears mouth and   genitals (private parts).                       Wash face,  Genitals (private parts) with your normal soap.             6.  Wash thoroughly, paying special attention to the area where your    surgery  will be performed.  7.  Thoroughly rinse your body with warm water from the neck down.  8.  DO NOT  shower/wash with your normal soap after using and rinsing off the CHG Soap.                9.  Pat yourself dry with a clean towel.            10.  Wear clean pajamas.            11.  Place clean sheets on your bed the night of your first shower and do not  sleep with pets. Day of Surgery : Do not apply any lotions/deodorants the morning of surgery.  Please wear clean clothes to the hospital/surgery center.  FAILURE TO FOLLOW THESE INSTRUCTIONS MAY RESULT IN THE CANCELLATION OF YOUR SURGERY  PATIENT SIGNATURE_________________________________  NURSE SIGNATURE__________________________________  ________________________________________________________________________

## 2019-11-07 NOTE — Progress Notes (Addendum)
COVID Vaccine Completed:  No Date COVID Vaccine completed: COVID vaccine manufacturer: [employer]   PCP - Merrilee Seashore, MD Cardiologist - Dr. Landry Corporal previously.  Last saw cardiology 2019 and pt unsure of physician's name  Chest x-ray -  EKG - 11-10-19 in Epic Stress Test -  ECHO -  Cardiac Cath -  Pacemaker/ICD device last checked:  Sleep Study -  CPAP -   Fasting Blood Sugar -  Checks Blood Sugar _____ times a day  Blood Thinner Instructions: Aspirin Instructions: ASA 81 mg.  Stopped for surgery Last Dose:  Anesthesia review: Hx of heart ablation due to SVT 2002.  No problems at this time  Patient denies shortness of breath, fever, cough and chest pain at PAT appointment   Patient verbalized understanding of instructions that were given to them at the PAT appointment. Patient was also instructed that they will need to review over the PAT instructions again at home before surgery.

## 2019-11-10 ENCOUNTER — Encounter (HOSPITAL_COMMUNITY)
Admission: RE | Admit: 2019-11-10 | Discharge: 2019-11-10 | Disposition: A | Discharge status: Home / Self Care | Payer: Medicare Other | Source: Ambulatory Visit | Attending: Gynecologic Oncology | Admitting: Gynecologic Oncology

## 2019-11-10 ENCOUNTER — Encounter (HOSPITAL_COMMUNITY): Payer: Self-pay

## 2019-11-10 ENCOUNTER — Other Ambulatory Visit: Payer: Self-pay

## 2019-11-10 DIAGNOSIS — I10 Essential (primary) hypertension: Secondary | ICD-10-CM | POA: Diagnosis not present

## 2019-11-10 DIAGNOSIS — Z01818 Encounter for other preprocedural examination: Secondary | ICD-10-CM | POA: Insufficient documentation

## 2019-11-10 HISTORY — DX: Essential (primary) hypertension: I10

## 2019-11-10 HISTORY — DX: Gastro-esophageal reflux disease without esophagitis: K21.9

## 2019-11-10 HISTORY — DX: Supraventricular tachycardia, unspecified: I47.10

## 2019-11-10 HISTORY — DX: Supraventricular tachycardia: I47.1

## 2019-11-10 LAB — CBC
HCT: 44.6 % (ref 36.0–46.0)
Hemoglobin: 14.7 g/dL (ref 12.0–15.0)
MCH: 29.1 pg (ref 26.0–34.0)
MCHC: 33 g/dL (ref 30.0–36.0)
MCV: 88.3 fL (ref 80.0–100.0)
Platelets: 279 10*3/uL (ref 150–400)
RBC: 5.05 MIL/uL (ref 3.87–5.11)
RDW: 13 % (ref 11.5–15.5)
WBC: 5.7 10*3/uL (ref 4.0–10.5)
nRBC: 0 % (ref 0.0–0.2)

## 2019-11-10 LAB — COMPREHENSIVE METABOLIC PANEL
ALT: 16 U/L (ref 0–44)
AST: 23 U/L (ref 15–41)
Albumin: 4.1 g/dL (ref 3.5–5.0)
Alkaline Phosphatase: 77 U/L (ref 38–126)
Anion gap: 8 (ref 5–15)
BUN: 6 mg/dL — ABNORMAL LOW (ref 8–23)
CO2: 26 mmol/L (ref 22–32)
Calcium: 9.4 mg/dL (ref 8.9–10.3)
Chloride: 109 mmol/L (ref 98–111)
Creatinine, Ser: 0.78 mg/dL (ref 0.44–1.00)
GFR, Estimated: >60 mL/min (ref >60)
Glucose, Bld: 99 mg/dL (ref 70–99)
Potassium: 3.9 mmol/L (ref 3.5–5.1)
Sodium: 143 mmol/L (ref 135–145)
Total Bilirubin: 0.8 mg/dL (ref 0.3–1.2)
Total Protein: 7.8 g/dL (ref 6.5–8.1)

## 2019-11-10 LAB — URINALYSIS, ROUTINE W REFLEX MICROSCOPIC
Bilirubin Urine: NEGATIVE
Glucose, UA: NEGATIVE mg/dL
Hgb urine dipstick: NEGATIVE
Ketones, ur: NEGATIVE mg/dL
Leukocytes,Ua: NEGATIVE
Nitrite: NEGATIVE
Protein, ur: NEGATIVE mg/dL
Specific Gravity, Urine: 1.004 — ABNORMAL LOW (ref 1.005–1.030)
pH: 6 (ref 5.0–8.0)

## 2019-11-10 NOTE — Progress Notes (Signed)
   11/10/19 1059  [place]  Have you ever been diagnosed with sleep apnea through a sleep study? No  Do you snore loudly (loud enough to be heard through closed doors)?  1  Do you often feel tired, fatigued, or sleepy during the daytime (such as falling asleep during driving or talking to someone)? 0  Has anyone observed you stop breathing during your sleep? 1  Do you have, or are you being treated for high blood pressure? 1  BMI more than 35 kg/m2? 1  Age > 50 (1-yes) 1  Neck circumference greater than:Female 16 inches or larger, Female 17inches or larger? 1  Female Gender (Yes=1) 0  Obstructive Sleep Apnea Score 6  Score 5 or greater  Results sent to PCP

## 2019-11-12 ENCOUNTER — Inpatient Hospital Stay: Payer: Medicare Other | Attending: Gynecologic Oncology | Admitting: Gynecologic Oncology

## 2019-11-12 ENCOUNTER — Other Ambulatory Visit: Payer: Self-pay

## 2019-11-12 ENCOUNTER — Encounter: Payer: Self-pay | Admitting: Gynecologic Oncology

## 2019-11-12 VITALS — BP 154/75 | HR 77 | Temp 98.0°F | Resp 20 | Ht 59.0 in | Wt 204.0 lb

## 2019-11-12 DIAGNOSIS — C541 Malignant neoplasm of endometrium: Secondary | ICD-10-CM

## 2019-11-12 MED ORDER — SENNOSIDES-DOCUSATE SODIUM 8.6-50 MG PO TABS: 2.0000 | ORAL_TABLET | Freq: Every day | ORAL | 0 refills | Status: DC

## 2019-11-12 MED ORDER — TRAMADOL HCL 50 MG PO TABS: 50.0000 mg | ORAL_TABLET | Freq: Four times a day (QID) | ORAL | 0 refills | Status: DC | PRN

## 2019-11-12 NOTE — Patient Instructions (Signed)
Preparing for your Surgery  Plan for surgery on November 18, 2019 with Dr. Everitt Amber at [facility] will be scheduled for a robotic assisted total laparoscopic hysterectomy (removal of the uterus and cervix), bilateral salpingo-oophorectomy (removal of both ovaries and fallopian tubes), sentinel lymph node biopsy, possible lymph node dissection.   Pre-operative Testing -(Done) You will receive a phone call from presurgical testing at [facility] to arrange for a pre-operative appointment over the phone, lab appointment, and COVID test. The COVID test normally happens 3 days prior to the surgery and they ask that you self quarantine after the test up until surgery to decrease chance of exposure.  -Bring your insurance card, copy of an advanced directive if applicable, medication list  -At that visit, you will be asked to sign a consent for a possible blood transfusion in case a transfusion becomes necessary during surgery.  The need for a blood transfusion is rare but having consent is a necessary part of your care.     -You should not be taking blood thinners or aspirin at least ten days prior to surgery unless instructed by your surgeon. YOU SHOULD BE OFF YOUR ASPIRIN AND FISH OIL.  -Do not take supplements such as fish oil (omega 3), red yeast rice, turmeric before your surgery.   Day Before Surgery at [place] will be asked to take in a light diet the day before surgery. You will be advised you can have clear liquids up until 3 hours before your surgery.    Eat a light diet the day before surgery.  Examples including soups, broths, toast, yogurt, mashed potatoes.  AVOID GAS PRODUCING FOODS. Things to avoid include carbonated beverages (fizzy beverages), raw fruits and raw vegetables, or beans.   If your bowels are filled with gas, your surgeon will have difficulty visualizing your pelvic organs which increases your surgical risks.  Your role in recovery Your role is  to become active as soon as directed by your doctor, while still giving yourself time to heal.  Rest when you feel tired. You will be asked to do the following in order to speed your recovery:  - Cough and breathe deeply. This helps to clear and expand your lungs and can prevent pneumonia after surgery.  - [place]. Do mild physical activity. Walking or moving your legs help your circulation and body functions return to normal. Do not try to get up or walk alone the first time after surgery.   -If you develop swelling on one leg or the other, pain in the back of your leg, redness/warmth in one of your legs, please call the office or go to the Emergency Room to have a doppler to rule out a blood clot. For shortness of breath, chest pain-seek care in the Emergency Room as soon as possible. - Actively manage your pain. Managing your pain lets you move in comfort. We will ask you to rate your pain on a scale of zero to 10. It is your responsibility to tell your doctor or nurse where and how much you hurt so your pain can be treated.  Special Considerations -If you are diabetic, you may be placed on insulin after surgery to have closer control over your blood sugars to promote healing and recovery.  This does not mean that you will be discharged on insulin.  If applicable, your oral antidiabetics will be resumed when you are tolerating a solid diet.  -Your final  pathology results from surgery should be available around one week after surgery and the results will be relayed to you when available.  -Dr. Lahoma Crocker is the surgeon that assists your GYN Oncologist with surgery.  If you end up staying the night, the next day after your surgery you will either see Dr. Denman George, Dr. Berline Lopes, or Dr. Lahoma Crocker.  -FMLA forms can be faxed to [phone] and please allow 5-7 business days for completion.  Pain Management After Surgery -You have been prescribed your pain medication  and bowel regimen medications before surgery so that you can have these available when you are discharged from the hospital. The pain medication is for use ONLY AFTER surgery and a new prescription will not be given.   -Make sure that you have Tylenol and Ibuprofen at home to use on a regular basis after surgery for pain control. We recommend alternating the medications every hour to six hours since they work differently and are processed in the body differently for pain relief.  -Review the attached handout on narcotic use and their risks and side effects.   Bowel Regimen -You have been prescribed Sennakot-S to take nightly to prevent constipation especially if you are taking the narcotic pain medication intermittently.  It is important to prevent constipation and drink adequate amounts of liquids. You can stop taking this medication when you are not taking pain medication and you are back on your normal bowel routine.  Risks of Surgery Risks of surgery are low but include bleeding, infection, damage to surrounding structures, re-operation, blood clots, and very rarely death.   Blood Transfusion Information (For the consent to be signed before surgery)  We will be checking your blood type before surgery so in case of emergencies, we will know what type of blood you would need.                                            WHAT IS A BLOOD TRANSFUSION?  A transfusion is the replacement of blood or some of its parts. Blood is made up of multiple cells which provide different functions.  Red blood cells carry oxygen and are used for blood loss replacement.  White blood cells fight against infection.  Platelets control bleeding.  Plasma helps clot blood.  Other blood products are available for specialized needs, such as hemophilia or other clotting disorders. BEFORE THE TRANSFUSION  Who gives blood for transfusions?   You may be able to donate blood to be used at a later date on yourself  (autologous donation).  Relatives can be asked to donate blood. This is generally not any safer than if you have received blood from a stranger. The same precautions are taken to ensure safety when a relative's blood is donated.  Healthy volunteers who are fully evaluated to make sure their blood is safe. This is blood bank blood. Transfusion therapy is the safest it has ever been in the practice of medicine. Before blood is taken from a donor, a complete history is taken to make sure that person has no history of diseases nor engages in risky social behavior (examples are intravenous drug use or sexual activity with multiple partners). The donor's travel history is screened to minimize risk of transmitting infections, such as malaria. The donated blood is tested for signs of infectious diseases, such as HIV and hepatitis. The blood is  then tested to be sure it is compatible with you in order to minimize the chance of a transfusion reaction. If you or a relative donates blood, this is often done in anticipation of surgery and is not appropriate for emergency situations. It takes many days to process the donated blood. RISKS AND COMPLICATIONS Although transfusion therapy is very safe and saves many lives, the main dangers of transfusion include:   Getting an infectious disease.  Developing a transfusion reaction. This is an allergic reaction to something in the blood you were given. Every precaution is taken to prevent this. The decision to have a blood transfusion has been considered carefully by your caregiver before blood is given. Blood is not given unless the benefits outweigh the risks.  AFTER SURGERY INSTRUCTIONS  Return to work: 4 weeks if applicable  Activity: 1. Be up and out of the bed during the day.  Take a nap if needed.  You may walk up steps but be careful and use the hand rail.  Stair climbing will tire you more than you think, you may need to stop part way and rest.   2. No  lifting or straining for 6 weeks over 10 pounds. No pushing, pulling, straining for 6 weeks.  3. No driving for 1 week(s) if you were cleared to drive before surgery.  Do not drive if you are taking narcotic pain medicine and make sure that your reaction time has returned.   4. You can shower as soon as the next day after surgery. Shower daily.  Use your regular soap and water (not directly on the incision) and pat your incision(s) dry afterwards; don't rub.  No tub baths or submerging your body in water until cleared by your surgeon. If you have the soap that was given to you by pre-surgical testing that was used before surgery, you do not need to use it afterwards because this can irritate your incisions.   5. No sexual activity and nothing in the vagina for 8 weeks.  6. You may experience a small amount of clear drainage from your incisions, which is normal.  If the drainage persists, increases, or changes color please call the office.  7. Do not use creams, lotions, or ointments such as neosporin on your incisions after surgery until advised by your surgeon because they can cause removal of the dermabond glue on your incisions.    8. You may experience vaginal spotting after surgery or around the 6-8 week mark from surgery when the stitches at the top of the vagina begin to dissolve.  The spotting is normal but if you experience heavy bleeding, call our office.  9. Take Tylenol or ibuprofen first for pain and only use narcotic pain medication for severe pain not relieved by the Tylenol or Ibuprofen.  Monitor your Tylenol intake to a max of 4,000 mg in a 24 hour period. You can alternate these medications after surgery.  Diet: 1. Low sodium Heart Healthy Diet is recommended but you are cleared to resume your normal (before surgery) diet after your procedure.  2. It is safe to use a laxative, such as Miralax or Colace, if you have difficulty moving your bowels. You have been prescribed Sennakot  at bedtime every evening to keep bowel movements regular and to prevent constipation.    Wound Care: 1. Keep clean and dry.  Shower daily.  Reasons to call the Doctor:  Fever - Oral temperature greater than 100.4 degrees Fahrenheit  Foul-smelling vaginal discharge  Difficulty urinating  Nausea and vomiting  Increased pain at the site of the incision that is unrelieved with pain medicine.  Difficulty breathing with or without chest pain  New calf pain especially if only on one side  Sudden, continuing increased vaginal bleeding with or without clots.   Contacts: For questions or concerns you should contact:  Dr. Everitt Amber at [phone]  Joylene John, NP at [phone]  After Hours: call [phone] and have the GYN Oncologist paged/contacted (after 5 pm or on the weekends)

## 2019-11-14 ENCOUNTER — Other Ambulatory Visit (HOSPITAL_COMMUNITY)
Admission: RE | Admit: 2019-11-14 | Discharge: 2019-11-14 | Disposition: A | Discharge status: Home / Self Care | Payer: Medicare Other | Source: Ambulatory Visit | Attending: Gynecologic Oncology | Admitting: Gynecologic Oncology

## 2019-11-14 ENCOUNTER — Other Ambulatory Visit (HOSPITAL_COMMUNITY): Payer: PRIVATE HEALTH INSURANCE

## 2019-11-14 DIAGNOSIS — Z01812 Encounter for preprocedural laboratory examination: Secondary | ICD-10-CM | POA: Insufficient documentation

## 2019-11-14 DIAGNOSIS — Z20822 Contact with and (suspected) exposure to covid-19: Secondary | ICD-10-CM | POA: Diagnosis not present

## 2019-11-15 LAB — SARS CORONAVIRUS 2 (TAT 6-24 HRS): SARS Coronavirus 2: NEGATIVE

## 2019-11-17 ENCOUNTER — Telehealth: Payer: Self-pay

## 2019-11-17 NOTE — Progress Notes (Signed)
Patient here for a pre-operative appointment prior to her scheduled surgery on November 18, 2019. She is scheduled for a robotic assisted total laparoscopic hysterectomy (removal of the uterus and cervix), bilateral salpingo-oophorectomy (removal of both ovaries and fallopian tubes), sentinel lymph node biopsy, possible lymph node dissection.  The surgery was discussed in detail.  See after visit summary for additional details. Visual aids used to discuss items related to surgery including the incentive spirometer, sequential compression stockings, foley catheter, IV pump, multi-modal pain regimen including tylenol, photo of the surgical robot, female reproductive system to discuss surgery in detail.      Discussed post-op pain management in detail including the aspects of the enhanced recovery pathway.  Advised her that a new prescription would be sent in for tramadol and it is only to be used for after her upcoming surgery.  We discussed the use of tylenol post-op and to monitor for a maximum of 4,000 mg in a 24 hour period.  Also prescribed sennakot to be used after surgery and to hold if having loose stools.  Discussed bowel regimen in detail.     Discussed the use of lovenox pre-op, SCDs, and measures to take at home to prevent DVT including frequent mobility.  Reportable signs and symptoms of DVT discussed. Post-operative instructions discussed and expectations for after surgery. Incisional care discussed as well including reportable signs and symptoms including erythema, drainage, wound separation.   15 minutes spent with the patient.  Verbalizing understanding of material discussed. No needs or concerns voiced at the end of the visit.   Advised patient to call for any needs.  Advised that her post-operative medications had been prescribed and could be picked up at any time.

## 2019-11-17 NOTE — Telephone Encounter (Signed)
Pt states that she understands her pre op instructions. She has not taken fish oil or ASA. No questions or concerns noted at this time.

## 2019-11-17 NOTE — Anesthesia Preprocedure Evaluation (Addendum)
Anesthesia Evaluation  Patient identified by MRN, date of birth, ID band Patient awake    Reviewed: Allergy & Precautions, NPO status , Patient's Chart, lab work & pertinent test results  Airway Mallampati: III  TM Distance: >3 FB Neck ROM: Full    Dental no notable dental hx. (+) Dental Advisory Given, Teeth Intact   Pulmonary neg pulmonary ROS,    Pulmonary exam normal breath sounds clear to auscultation       Cardiovascular hypertension, Pt. on medications Normal cardiovascular exam Rhythm:Regular Rate:Normal     Neuro/Psych negative neurological ROS     GI/Hepatic Neg liver ROS, GERD  ,  Endo/Other  Morbid obesity  Renal/GU negative Renal ROS     Musculoskeletal  (+) Arthritis ,   Abdominal (+) + obese,   Peds  Hematology negative hematology ROS (+)   Anesthesia Other Findings   Reproductive/Obstetrics                            Anesthesia Physical Anesthesia Plan  ASA: III  Anesthesia Plan: General   Post-op Pain Management:    Induction: Intravenous  PONV Risk Score and Plan: 4 or greater and Ondansetron, Dexamethasone and Treatment may vary due to age or medical condition  Airway Management Planned: Oral ETT and Video Laryngoscope Planned  Additional Equipment:   Intra-op Plan:   Post-operative Plan: Extubation in OR  Informed Consent: I have reviewed the patients History and Physical, chart, labs and discussed the procedure including the risks, benefits and alternatives for the proposed anesthesia with the patient or authorized representative who has indicated his/her understanding and acceptance.     Dental advisory given  Plan Discussed with: CRNA  Anesthesia Plan Comments: (+/- glidescope)       Anesthesia Quick Evaluation

## 2019-11-18 ENCOUNTER — Other Ambulatory Visit: Payer: Self-pay

## 2019-11-18 ENCOUNTER — Ambulatory Visit (HOSPITAL_COMMUNITY)
Admission: RE | Admit: 2019-11-18 | Discharge: 2019-11-19 | Disposition: A | Discharge status: Home / Self Care | Payer: Medicare Other | Attending: Gynecologic Oncology | Admitting: Gynecologic Oncology

## 2019-11-18 ENCOUNTER — Ambulatory Visit (HOSPITAL_COMMUNITY): Payer: Medicare Other | Admitting: Anesthesiology

## 2019-11-18 ENCOUNTER — Ambulatory Visit (HOSPITAL_COMMUNITY): Payer: Medicare Other | Admitting: Physician Assistant

## 2019-11-18 ENCOUNTER — Encounter (HOSPITAL_COMMUNITY)
Admission: RE | Disposition: A | Discharge status: Home / Self Care | Payer: Self-pay | Source: Home / Self Care | Attending: Gynecologic Oncology

## 2019-11-18 ENCOUNTER — Encounter (HOSPITAL_COMMUNITY): Payer: Self-pay | Admitting: Gynecologic Oncology

## 2019-11-18 DIAGNOSIS — N736 Female pelvic peritoneal adhesions (postinfective): Secondary | ICD-10-CM | POA: Diagnosis not present

## 2019-11-18 DIAGNOSIS — Z79899 Other long term (current) drug therapy: Secondary | ICD-10-CM | POA: Diagnosis not present

## 2019-11-18 DIAGNOSIS — M1990 Unspecified osteoarthritis, unspecified site: Secondary | ICD-10-CM | POA: Diagnosis not present

## 2019-11-18 DIAGNOSIS — Z9011 Acquired absence of right breast and nipple: Secondary | ICD-10-CM | POA: Diagnosis not present

## 2019-11-18 DIAGNOSIS — Z853 Personal history of malignant neoplasm of breast: Secondary | ICD-10-CM | POA: Diagnosis not present

## 2019-11-18 DIAGNOSIS — E7800 Pure hypercholesterolemia, unspecified: Secondary | ICD-10-CM | POA: Insufficient documentation

## 2019-11-18 DIAGNOSIS — Z882 Allergy status to sulfonamides status: Secondary | ICD-10-CM | POA: Insufficient documentation

## 2019-11-18 DIAGNOSIS — Z7982 Long term (current) use of aspirin: Secondary | ICD-10-CM | POA: Diagnosis not present

## 2019-11-18 DIAGNOSIS — Z7952 Long term (current) use of systemic steroids: Secondary | ICD-10-CM | POA: Diagnosis not present

## 2019-11-18 DIAGNOSIS — Z886 Allergy status to analgesic agent status: Secondary | ICD-10-CM | POA: Insufficient documentation

## 2019-11-18 DIAGNOSIS — Z6841 Body Mass Index (BMI) 40.0 and over, adult: Secondary | ICD-10-CM | POA: Diagnosis not present

## 2019-11-18 DIAGNOSIS — K219 Gastro-esophageal reflux disease without esophagitis: Secondary | ICD-10-CM | POA: Diagnosis not present

## 2019-11-18 DIAGNOSIS — C541 Malignant neoplasm of endometrium: Secondary | ICD-10-CM | POA: Diagnosis not present

## 2019-11-18 DIAGNOSIS — I10 Essential (primary) hypertension: Secondary | ICD-10-CM | POA: Diagnosis not present

## 2019-11-18 HISTORY — PX: SENTINEL NODE BIOPSY: SHX6608

## 2019-11-18 HISTORY — PX: ROBOTIC ASSISTED TOTAL HYSTERECTOMY WITH BILATERAL SALPINGO OOPHERECTOMY: SHX6086

## 2019-11-18 HISTORY — PX: ROBOTIC ASSISTED LAPAROSCOPIC LYSIS OF ADHESION: SHX6080

## 2019-11-18 LAB — GLUCOSE, CAPILLARY: Glucose-Capillary: 150 mg/dL — ABNORMAL HIGH (ref 70–99)

## 2019-11-18 LAB — TYPE AND SCREEN
ABO/RH(D): O NEG
Antibody Screen: NEGATIVE

## 2019-11-18 SURGERY — HYSTERECTOMY, TOTAL, ROBOT-ASSISTED, LAPAROSCOPIC, WITH BILATERAL SALPINGO-OOPHORECTOMY
Anesthesia: General

## 2019-11-18 MED ORDER — AMLODIPINE BESYLATE 5 MG PO TABS
5.0000 mg | ORAL_TABLET | Freq: Every day | ORAL | Status: DC
  Administered 2019-11-19: 5 mg via ORAL
  Filled 2019-11-18: qty 1

## 2019-11-18 MED ORDER — HYDROMORPHONE HCL 1 MG/ML IJ SOLN
INTRAMUSCULAR | Status: AC
  Filled 2019-11-18: qty 1

## 2019-11-18 MED ORDER — KCL IN DEXTROSE-NACL 20-5-0.45 MEQ/L-%-% IV SOLN
INTRAVENOUS | Status: DC
  Filled 2019-11-18: qty 1000

## 2019-11-18 MED ORDER — KETAMINE HCL 10 MG/ML IJ SOLN
INTRAMUSCULAR | Status: DC | PRN
  Administered 2019-11-18: 45 mg via INTRAVENOUS

## 2019-11-18 MED ORDER — DEXTROSE-NACL 5-0.45 % IV SOLN: INTRAVENOUS | Status: DC

## 2019-11-18 MED ORDER — STERILE WATER FOR INJECTION IJ SOLN
INTRAMUSCULAR | Status: DC | PRN
  Administered 2019-11-18: 10 mL

## 2019-11-18 MED ORDER — FENTANYL CITRATE (PF) 250 MCG/5ML IJ SOLN
INTRAMUSCULAR | Status: DC | PRN
Start: 2019-11-18 — End: 2019-11-18
  Administered 2019-11-18 (×4): 50 ug via INTRAVENOUS

## 2019-11-18 MED ORDER — ONDANSETRON HCL 4 MG/2ML IJ SOLN
INTRAMUSCULAR | Status: AC
  Filled 2019-11-18: qty 2

## 2019-11-18 MED ORDER — LIDOCAINE 2% (20 MG/ML) 5 ML SYRINGE
INTRAMUSCULAR | Status: AC
  Filled 2019-11-18: qty 5

## 2019-11-18 MED ORDER — CHLORHEXIDINE GLUCONATE 0.12 % MT SOLN
15.0000 mL | Freq: Once | OROMUCOSAL | Status: AC
  Administered 2019-11-18: 15 mL via OROMUCOSAL

## 2019-11-18 MED ORDER — OXYCODONE HCL 5 MG PO TABS: 5.0000 mg | ORAL_TABLET | ORAL | Status: DC | PRN

## 2019-11-18 MED ORDER — MORPHINE SULFATE (PF) 4 MG/ML IV SOLN: 2.0000 mg | INTRAVENOUS | Status: DC | PRN

## 2019-11-18 MED ORDER — SUGAMMADEX SODIUM 200 MG/2ML IV SOLN
INTRAVENOUS | Status: DC | PRN
  Administered 2019-11-18: 200 mg via INTRAVENOUS

## 2019-11-18 MED ORDER — LIDOCAINE HCL (CARDIAC) PF 100 MG/5ML IV SOSY
PREFILLED_SYRINGE | INTRAVENOUS | Status: DC | PRN
  Administered 2019-11-18: 80 mg via INTRAVENOUS

## 2019-11-18 MED ORDER — BUPIVACAINE HCL 0.25 % IJ SOLN
INTRAMUSCULAR | Status: DC | PRN
  Administered 2019-11-18: 21 mL

## 2019-11-18 MED ORDER — HYDROMORPHONE HCL 1 MG/ML IJ SOLN: 0.5000 mg | INTRAMUSCULAR | Status: DC | PRN

## 2019-11-18 MED ORDER — LACTATED RINGERS IV SOLN: INTRAVENOUS | Status: DC

## 2019-11-18 MED ORDER — ONDANSETRON HCL 4 MG/2ML IJ SOLN
INTRAMUSCULAR | Status: DC | PRN
  Administered 2019-11-18: 4 mg via INTRAVENOUS

## 2019-11-18 MED ORDER — STERILE WATER FOR INJECTION IJ SOLN
INTRAMUSCULAR | Status: DC | PRN
  Administered 2019-11-18: 4 mL via INTRAVENOUS

## 2019-11-18 MED ORDER — KETAMINE HCL 10 MG/ML IJ SOLN
INTRAMUSCULAR | Status: AC
  Filled 2019-11-18: qty 1

## 2019-11-18 MED ORDER — STERILE WATER FOR INJECTION IJ SOLN
INTRAMUSCULAR | Status: AC
  Filled 2019-11-18: qty 10

## 2019-11-18 MED ORDER — PHENYLEPHRINE HCL-NACL 10-0.9 MG/250ML-% IV SOLN
INTRAVENOUS | Status: DC | PRN
  Administered 2019-11-18: 50 ug/min via INTRAVENOUS

## 2019-11-18 MED ORDER — PROPOFOL 10 MG/ML IV BOLUS
INTRAVENOUS | Status: DC | PRN
  Administered 2019-11-18: 150 mg via INTRAVENOUS
  Administered 2019-11-18: 50 mg via INTRAVENOUS

## 2019-11-18 MED ORDER — SENNOSIDES-DOCUSATE SODIUM 8.6-50 MG PO TABS: 2.0000 | ORAL_TABLET | Freq: Every day | ORAL | Status: DC

## 2019-11-18 MED ORDER — ONDANSETRON HCL 4 MG/2ML IJ SOLN: 4.0000 mg | Freq: Four times a day (QID) | INTRAMUSCULAR | Status: DC | PRN

## 2019-11-18 MED ORDER — SODIUM CHLORIDE 0.9% FLUSH: 3.0000 mL | INTRAVENOUS | Status: DC | PRN

## 2019-11-18 MED ORDER — DEXAMETHASONE SODIUM PHOSPHATE 4 MG/ML IJ SOLN: 4.0000 mg | INTRAMUSCULAR | Status: DC

## 2019-11-18 MED ORDER — LACTATED RINGERS IR SOLN
Status: DC | PRN
  Administered 2019-11-18: 1000 mL

## 2019-11-18 MED ORDER — ONDANSETRON HCL 4 MG/2ML IJ SOLN: 4.0000 mg | Freq: Once | INTRAMUSCULAR | Status: DC | PRN

## 2019-11-18 MED ORDER — ENOXAPARIN SODIUM 40 MG/0.4ML ~~LOC~~ SOLN
40.0000 mg | SUBCUTANEOUS | Status: AC
  Administered 2019-11-18: 40 mg via SUBCUTANEOUS
  Filled 2019-11-18: qty 0.4

## 2019-11-18 MED ORDER — ONDANSETRON HCL 4 MG PO TABS: 4.0000 mg | ORAL_TABLET | Freq: Four times a day (QID) | ORAL | Status: DC | PRN

## 2019-11-18 MED ORDER — PROPOFOL 10 MG/ML IV BOLUS
INTRAVENOUS | Status: AC
  Filled 2019-11-18: qty 20

## 2019-11-18 MED ORDER — PHENYLEPHRINE 40 MCG/ML (10ML) SYRINGE FOR IV PUSH (FOR BLOOD PRESSURE SUPPORT)
PREFILLED_SYRINGE | INTRAVENOUS | Status: DC | PRN
  Administered 2019-11-18 (×2): 120 ug via INTRAVENOUS
  Administered 2019-11-18: 40 ug via INTRAVENOUS
  Administered 2019-11-18: 120 ug via INTRAVENOUS

## 2019-11-18 MED ORDER — ACETAMINOPHEN 650 MG RE SUPP
650.0000 mg | RECTAL | Status: DC | PRN
  Filled 2019-11-18: qty 1

## 2019-11-18 MED ORDER — KETOROLAC TROMETHAMINE 30 MG/ML IJ SOLN
INTRAMUSCULAR | Status: AC
  Filled 2019-11-18: qty 1

## 2019-11-18 MED ORDER — ACETAMINOPHEN 500 MG PO TABS
1000.0000 mg | ORAL_TABLET | ORAL | Status: AC
  Administered 2019-11-18: 1000 mg via ORAL
  Filled 2019-11-18: qty 2

## 2019-11-18 MED ORDER — ACETAMINOPHEN 325 MG PO TABS: 650.0000 mg | ORAL_TABLET | ORAL | Status: DC | PRN

## 2019-11-18 MED ORDER — ENOXAPARIN SODIUM 40 MG/0.4ML ~~LOC~~ SOLN
40.0000 mg | SUBCUTANEOUS | Status: DC
  Administered 2019-11-19: 40 mg via SUBCUTANEOUS
  Filled 2019-11-18: qty 0.4

## 2019-11-18 MED ORDER — ORAL CARE MOUTH RINSE: 15.0000 mL | Freq: Once | OROMUCOSAL | Status: AC

## 2019-11-18 MED ORDER — DEXAMETHASONE SODIUM PHOSPHATE 10 MG/ML IJ SOLN
INTRAMUSCULAR | Status: AC
  Filled 2019-11-18: qty 1

## 2019-11-18 MED ORDER — SODIUM CHLORIDE 0.9 % IV SOLN: 250.0000 mL | INTRAVENOUS | Status: DC | PRN

## 2019-11-18 MED ORDER — DEXAMETHASONE SODIUM PHOSPHATE 10 MG/ML IJ SOLN
INTRAMUSCULAR | Status: DC | PRN
  Administered 2019-11-18: 5 mg via INTRAVENOUS

## 2019-11-18 MED ORDER — STERILE WATER FOR IRRIGATION IR SOLN
Status: DC | PRN
  Administered 2019-11-18: 1000 mL

## 2019-11-18 MED ORDER — HYDROMORPHONE HCL 1 MG/ML IJ SOLN
0.2500 mg | INTRAMUSCULAR | Status: DC | PRN
  Administered 2019-11-18 (×3): 0.5 mg via INTRAVENOUS

## 2019-11-18 MED ORDER — BUPIVACAINE HCL 0.25 % IJ SOLN
INTRAMUSCULAR | Status: AC
  Filled 2019-11-18: qty 1

## 2019-11-18 MED ORDER — MEPERIDINE HCL 50 MG/ML IJ SOLN: 6.2500 mg | INTRAMUSCULAR | Status: DC | PRN

## 2019-11-18 MED ORDER — ROCURONIUM BROMIDE 10 MG/ML (PF) SYRINGE
PREFILLED_SYRINGE | INTRAVENOUS | Status: AC
  Filled 2019-11-18: qty 10

## 2019-11-18 MED ORDER — ROCURONIUM BROMIDE 10 MG/ML (PF) SYRINGE
PREFILLED_SYRINGE | INTRAVENOUS | Status: DC | PRN
  Administered 2019-11-18: 20 mg via INTRAVENOUS
  Administered 2019-11-18: 70 mg via INTRAVENOUS

## 2019-11-18 MED ORDER — ACETAMINOPHEN 500 MG PO TABS
1000.0000 mg | ORAL_TABLET | Freq: Two times a day (BID) | ORAL | Status: DC
  Administered 2019-11-19: 1000 mg via ORAL
  Filled 2019-11-18: qty 2

## 2019-11-18 MED ORDER — KETOROLAC TROMETHAMINE 30 MG/ML IJ SOLN
INTRAMUSCULAR | Status: DC | PRN
  Administered 2019-11-18: 15 mg via INTRAVENOUS

## 2019-11-18 MED ORDER — SODIUM CHLORIDE 0.9% FLUSH: 3.0000 mL | Freq: Two times a day (BID) | INTRAVENOUS | Status: DC

## 2019-11-18 MED ORDER — CEFAZOLIN SODIUM-DEXTROSE 2-4 GM/100ML-% IV SOLN
2.0000 g | INTRAVENOUS | Status: AC
  Administered 2019-11-18: 2 g via INTRAVENOUS
  Filled 2019-11-18: qty 100

## 2019-11-18 MED ORDER — FENTANYL CITRATE (PF) 250 MCG/5ML IJ SOLN
INTRAMUSCULAR | Status: AC
  Filled 2019-11-18: qty 5

## 2019-11-18 SURGICAL SUPPLY — 70 items
ADH SKN CLS APL DERMABOND .7 (GAUZE/BANDAGES/DRESSINGS) ×2
AGENT HMST KT MTR STRL THRMB (HEMOSTASIS)
APL ESCP 34 STRL LF DISP (HEMOSTASIS)
APPLICATOR SURGIFLO ENDO (HEMOSTASIS) IMPLANT
BACTOSHIELD CHG 4% 4OZ (MISCELLANEOUS) ×1
BAG LAPAROSCOPIC 12 15 PORT 16 (BASKET) IMPLANT
BAG RETRIEVAL 12/15 (BASKET)
BAG SPEC RTRVL LRG 6X4 10 (ENDOMECHANICALS)
BLADE SURG SZ10 CARB STEEL (BLADE) IMPLANT
COVER BACK TABLE 60X90IN (DRAPES) ×3 IMPLANT
COVER TIP SHEARS 8 DVNC (MISCELLANEOUS) ×2 IMPLANT
COVER TIP SHEARS 8MM DA VINCI (MISCELLANEOUS) ×3
COVER WAND RF STERILE (DRAPES) IMPLANT
DECANTER SPIKE VIAL GLASS SM (MISCELLANEOUS) ×2 IMPLANT
DERMABOND ADVANCED (GAUZE/BANDAGES/DRESSINGS) ×1
DERMABOND ADVANCED .7 DNX12 (GAUZE/BANDAGES/DRESSINGS) ×2 IMPLANT
DRAPE ARM DVNC X/XI (DISPOSABLE) ×8 IMPLANT
DRAPE COLUMN DVNC XI (DISPOSABLE) ×2 IMPLANT
DRAPE DA VINCI XI ARM (DISPOSABLE) ×12
DRAPE DA VINCI XI COLUMN (DISPOSABLE) ×3
DRAPE SHEET LG 3/4 BI-LAMINATE (DRAPES) ×3 IMPLANT
DRAPE SURG IRRIG POUCH 19X23 (DRAPES) ×3 IMPLANT
DRSG OPSITE POSTOP 4X6 (GAUZE/BANDAGES/DRESSINGS) IMPLANT
DRSG OPSITE POSTOP 4X8 (GAUZE/BANDAGES/DRESSINGS) IMPLANT
ELECT PENCIL ROCKER SW 15FT (MISCELLANEOUS) IMPLANT
ELECT REM PT RETURN 15FT ADLT (MISCELLANEOUS) ×3 IMPLANT
GLOVE BIO SURGEON STRL SZ 6 (GLOVE) ×12 IMPLANT
GLOVE BIO SURGEON STRL SZ 6.5 (GLOVE) ×6 IMPLANT
GOWN STRL REUS W/ TWL LRG LVL3 (GOWN DISPOSABLE) ×8 IMPLANT
GOWN STRL REUS W/TWL LRG LVL3 (GOWN DISPOSABLE) ×12
HOLDER FOLEY CATH W/STRAP (MISCELLANEOUS) ×3 IMPLANT
IRRIG SUCT STRYKERFLOW 2 WTIP (MISCELLANEOUS) ×3
IRRIGATION SUCT STRKRFLW 2 WTP (MISCELLANEOUS) ×2 IMPLANT
KIT PROCEDURE DA VINCI SI (MISCELLANEOUS) ×3
KIT PROCEDURE DVNC SI (MISCELLANEOUS) IMPLANT
KIT TURNOVER KIT A (KITS) IMPLANT
MANIPULATOR UTERINE 4.5 ZUMI (MISCELLANEOUS) ×3 IMPLANT
NDL SPNL 18GX3.5 QUINCKE PK (NEEDLE) IMPLANT
NEEDLE HYPO 22GX1.5 SAFETY (NEEDLE) ×3 IMPLANT
NEEDLE SPNL 18GX3.5 QUINCKE PK (NEEDLE) ×3 IMPLANT
OBTURATOR OPTICAL STANDARD 8MM (TROCAR) ×3
OBTURATOR OPTICAL STND 8 DVNC (TROCAR) ×2
OBTURATOR OPTICALSTD 8 DVNC (TROCAR) ×2 IMPLANT
PACK ROBOT GYN CUSTOM WL (TRAY / TRAY PROCEDURE) ×3 IMPLANT
PAD POSITIONING PINK XL (MISCELLANEOUS) ×3 IMPLANT
PORT ACCESS TROCAR AIRSEAL 12 (TROCAR) ×2 IMPLANT
PORT ACCESS TROCAR AIRSEAL 5M (TROCAR) ×1
POUCH SPECIMEN RETRIEVAL 10MM (ENDOMECHANICALS) IMPLANT
SCISSORS LAP 5X35 DISP (ENDOMECHANICALS) ×1 IMPLANT
SCRUB CHG 4% DYNA-HEX 4OZ (MISCELLANEOUS) ×2 IMPLANT
SEAL CANN UNIV 5-8 DVNC XI (MISCELLANEOUS) ×6 IMPLANT
SEAL XI 5MM-8MM UNIVERSAL (MISCELLANEOUS) ×9
SET TRI-LUMEN FLTR TB AIRSEAL (TUBING) ×3 IMPLANT
SPONGE LAP 18X18 RF (DISPOSABLE) IMPLANT
SURGIFLO W/THROMBIN 8M KIT (HEMOSTASIS) IMPLANT
SUT MNCRL AB 4-0 PS2 18 (SUTURE) IMPLANT
SUT PDS AB 1 TP1 96 (SUTURE) IMPLANT
SUT VIC AB 0 CT1 27 (SUTURE)
SUT VIC AB 0 CT1 27XBRD ANTBC (SUTURE) IMPLANT
SUT VIC AB 2-0 CT1 27 (SUTURE)
SUT VIC AB 2-0 CT1 TAPERPNT 27 (SUTURE) IMPLANT
SUT VIC AB 4-0 PS2 18 (SUTURE) ×6 IMPLANT
SYR 10ML LL (SYRINGE) ×1 IMPLANT
SYR 20ML LL LF (SYRINGE) IMPLANT
TOWEL OR NON WOVEN STRL DISP B (DISPOSABLE) ×3 IMPLANT
TRAP SPECIMEN MUCUS 40CC (MISCELLANEOUS) IMPLANT
TRAY FOLEY MTR SLVR 16FR STAT (SET/KITS/TRAYS/PACK) ×3 IMPLANT
TROCAR XCEL NON-BLD 5MMX100MML (ENDOMECHANICALS) IMPLANT
UNDERPAD 30X36 HEAVY ABSORB (UNDERPADS AND DIAPERS) ×3 IMPLANT
WATER STERILE IRR 1000ML POUR (IV SOLUTION) ×3 IMPLANT

## 2019-11-18 NOTE — Progress Notes (Signed)
Dr. Jackson at bedside

## 2019-11-18 NOTE — Transfer of Care (Signed)
Immediate Anesthesia Transfer of Care Note  Patient: Tiffany Gilmore [facility]  Procedure(s) Performed: XI ROBOTIC ASSISTED TOTAL HYSTERECTOMY WITH BILATERAL SALPINGO OOPHORECTOMY (Bilateral ) SENTINEL NODE BIOPSY (N/A ) XI ROBOTIC ASSISTED LAPAROSCOPIC LYSIS OF ADHESION (N/A )  Patient Location: PACU  Anesthesia Type:General  Level of Consciousness: awake and lethargic  Airway & Oxygen Therapy: Patient Spontanous Breathing and Patient connected to face mask oxygen  Post-op Assessment: Report given to RN and Post -op Vital signs reviewed and stable  Post vital signs: Reviewed and stable  Last Vitals:  Vitals Value Taken Time  BP 142/108 11/18/19 0940  Temp    Pulse 78 11/18/19 0943  Resp 20 11/18/19 0943  SpO2 100 % 11/18/19 0943  Vitals shown include unvalidated device data.  Last Pain:  Vitals:   11/18/19 0546  TempSrc:   PainSc: 2          Complications: No complications documented.

## 2019-11-18 NOTE — Anesthesia Postprocedure Evaluation (Signed)
Anesthesia Post Note  Patient: Tiffany Gilmore [facility]  Procedure(s) Performed: XI ROBOTIC ASSISTED TOTAL HYSTERECTOMY WITH BILATERAL SALPINGO OOPHORECTOMY (Bilateral ) SENTINEL NODE BIOPSY (N/A ) XI ROBOTIC ASSISTED LAPAROSCOPIC LYSIS OF ADHESION (N/A )     Patient location during evaluation: PACU Anesthesia Type: General Level of consciousness: sedated and patient cooperative Pain management: pain level controlled Vital Signs Assessment: post-procedure vital signs reviewed and stable Respiratory status: spontaneous breathing Cardiovascular status: stable Anesthetic complications: no Comments: Pt with prolonged sedation. Was made aware after patient had already been in PACU > 3 hours. Pt wakes easily and is oriented, but drifts back to sleep easily. SpO2 98% on 2lnc. Requested attempt to move patient to recliner to see if she could be stimulated awake a bit more.    No complications documented.  Last Vitals:  Vitals:   11/18/19 1400 11/18/19 1415  BP: (!) 150/67 126/76  Pulse: 77 81  Resp: 14 14  Temp:    SpO2: 91% (!) 89%    Last Pain:  Vitals:   11/18/19 1330  TempSrc:   PainSc: [place]

## 2019-11-18 NOTE — Progress Notes (Signed)
Dr Lissa Hoard at bedside

## 2019-11-18 NOTE — Op Note (Signed)
OPERATIVE NOTE 11/18/19  Surgeon: Donaciano Eva   Assistants: Dr Lahoma Crocker (an MD assistant was necessary for tissue manipulation, management of robotic instrumentation, retraction and positioning due to the complexity of the case and hospital policies).   Anesthesia: General endotracheal anesthesia  ASA Class: 3   Pre-operative Diagnosis: endometrial cancer grade 1, morbid obesity  Post-operative Diagnosis: same, pelvic adhesive diseaes  Operation: Robotic-assisted laparoscopic total hysterectomy with bilateral salpingoophorectomy, SLN biopsy, pelvic adhesiolysis   Surgeon: Donaciano Eva  Assistant Surgeon: Lahoma Crocker MD  Anesthesia: GET  Urine Output: 200cc  Operative Findings:  : 6cm uterus, normal tubes and ovaries. Dense omental adhesions to anterior abdominal wall and to uterus. Adhesions between omentum and uterus to bladder. No gross extrauterine disease.  Estimated Blood Loss:  less than 50 mL      Total IV Fluids: 700 ml         Specimens: uterus, cervix, bilateral tubes and ovaries with omentum; right and left obturator SLNs         Complications:  None; patient tolerated the procedure well.         Disposition: PACU - hemodynamically stable.  Procedure Details  The patient was seen in the Holding Room. The risks, benefits, complications, treatment options, and expected outcomes were discussed with the patient.  The patient concurred with the proposed plan, giving informed consent.  The site of surgery properly noted/marked. The patient was identified as Tiffany Gilmore and the procedure verified as a Robotic-assisted hysterectomy with bilateral salpingo oophorectomy with SLN biopsy. A Time Out was held and the above information confirmed.  After induction of anesthesia, the patient was draped and prepped in the usual sterile manner. Pt was placed in supine position after anesthesia and draped and prepped in the usual sterile manner. The  abdominal drape was placed after the CholoraPrep had been allowed to dry for 3 minutes.  Her arms were tucked to her side with all appropriate precautions.  The shoulders were stabilized with padded shoulder blocks applied to the acromium processes.  The patient was placed in the semi-lithotomy position in [place].  The perineum was prepped with Betadine. The patient was then prepped. Foley catheter was placed.  A sterile speculum was placed in the vagina.  The cervix was grasped with a single-tooth tenaculum. 2mg  total of ICG was injected into the cervical stroma at 2 and 9 o'clock with 1cc injected at a 1cm and 82mm depth (concentration 0.5mg /ml) in all locations. The cervix was dilated with Kennon Rounds dilators.  The ZUMI uterine manipulator with a medium colpotomizer ring was placed without difficulty.  A pneum occluder balloon was placed over the manipulator.  OG tube placement was confirmed and to suction.   Next, a 5 mm skin incision was made 1 cm below the subcostal margin in the midclavicular line.  The 5 mm Optiview port and scope was used for direct entry.  Opening pressure was under 10 mm CO2.  The abdomen was insufflated and the findings were noted as above.   At this point and all points during the procedure, the patient's intra-abdominal pressure did not exceed 15 mmHg. Next, a 10 mm skin incision was made 2cm above the umbilicus and a right and left port was placed about 10 cm lateral to the robot port on the right and left side.  A fourth arm was placed in the left lower quadrant 2 cm above and superior and medial to the anterior superior iliac spine.  All ports were placed under direct visualization.  The patient was placed in steep Trendelenburg.  For 30 minutes sharp adhesiolysis with endoscopic scissors was performed to separate omental adhesions to the anterior abdominal wall, uterus and bladder. The distal infracolic omentum was separated and left attached to the uterine fundus using  bipolar sealing.  Bowel was folded away into the upper abdomen.  The robot was docked in the normal manner.  The right and left peritoneum were opened parallel to the IP ligament to open the retroperitoneal spaces bilaterally. The SLN mapping was performed in bilateral pelvic basins. The para rectal and paravesical spaces were opened up entirely with careful dissection below the level of the ureters bilaterally and to the depth of the uterine artery origin in order to skeletonize the uterine "web" and ensure visualization of all parametrial channels. The para-aortic basins were carefully exposed and evaluated for isolated para-aortic SLN's. Lymphatic channels were identified travelling to the following visualized sentinel lymph node's: right and left obturator SLNs. These SLN's were separated from their surrounding lymphatic tissue, removed and sent for permanent pathology.  The hysterectomy was started after the round ligament on the right side was incised and the retroperitoneum was entered and the pararectal space was developed.  The ureter was noted to be on the medial leaf of the broad ligament.  The peritoneum above the ureter was incised and stretched and the infundibulopelvic ligament was skeletonized, cauterized and cut.  The posterior peritoneum was taken down to the level of the KOH ring.  The anterior peritoneum was also taken down.  The bladder flap was created to the level of the KOH ring.  The uterine artery on the right side was skeletonized, cauterized and cut in the normal manner.  A similar procedure was performed on the left.  The colpotomy was made and the uterus, cervix, bilateral ovaries and tubes were amputated and delivered through the vagina.  Pedicles were inspected and excellent hemostasis was achieved.    The colpotomy at the vaginal cuff was closed with Vicryl on a CT1 needle in a running manner.  Irrigation was used and excellent hemostasis was achieved.  At this point in the  procedure was completed.  Robotic instruments were removed under direct visulaization.  The robot was undocked. The 10 mm ports were closed with Vicryl on a UR-5 needle and the fascia was closed with 0 Vicryl on a UR-5 needle.  The skin was closed with 4-0 Vicryl in a subcuticular manner.  Dermabond was applied.  Sponge, lap and needle counts correct x 2.  The patient was taken to the recovery room in stable condition.  The vagina was swabbed with  minimal bleeding noted.   All instrument and needle counts were correct x  3.   The patient was transferred to the recovery room in a stable condition.  Donaciano Eva, MD

## 2019-11-18 NOTE — Progress Notes (Signed)
Attempted to amb pt OOB to BR without success. Pt sleepy, could not remain engaged to use walker. Pt remained in stretcher and placed back on monitors in Phase 1.

## 2019-11-18 NOTE — Anesthesia Procedure Notes (Signed)
Procedure Name: Intubation Date/Time: 11/18/2019 7:33 AM Performed by: Raenette Rover, CRNA Pre-anesthesia Checklist: Patient identified, Emergency Drugs available, Suction available and Patient being monitored Patient Re-evaluated:Patient Re-evaluated prior to induction Oxygen Delivery Method: Circle system utilized Preoxygenation: Pre-oxygenation with 100% oxygen Induction Type: IV induction Ventilation: Mask ventilation without difficulty Laryngoscope Size: Glidescope and 3 Grade View: Grade I Tube type: Oral Tube size: 7.0 mm Number of attempts: 3 Airway Equipment and Method: Stylet and Video-laryngoscopy Placement Confirmation: ETT inserted through vocal cords under direct vision,  positive ETCO2 and breath sounds checked- equal and bilateral Secured at: 23 cm Tube secured with: Tape Dental Injury: Teeth and Oropharynx as per pre-operative assessment  Difficulty Due To: Difficulty was unanticipated Comments: DL x2 by EMT with MAC 3--unsuccessful intubation. ETT removed from esophagus with no issues noted. Easy mask ventilation with no OAW needed. Glidescope DL x1 by CRNA with successful intubation. Pt with no adverse issues noted.

## 2019-11-18 NOTE — Progress Notes (Signed)
Dr Lissa Hoard at bedside to assess pt's readiness for discharge home. Aware of decreased o2 sats on RA, and that she was unable to stand to amb to recliner. States she needs more time in pacu

## 2019-11-18 NOTE — Interval H&P Note (Signed)
History and Physical Interval Note:  11/18/2019 7:07 AM  Tiffany Gilmore  has presented today for surgery, with the diagnosis of ENDOMETRIAL CANCER.  The various methods of treatment have been discussed with the patient and family. After consideration of risks, benefits and other options for treatment, the patient has consented to  Procedure(s): XI ROBOTIC ASSISTED TOTAL HYSTERECTOMY WITH BILATERAL SALPINGO OOPHORECTOMY (Bilateral) SENTINEL NODE BIOPSY (N/A) as a surgical intervention.  The patient's history has been reviewed, patient examined, no change in status, stable for surgery.  I have reviewed the patient's chart and labs.  Questions were answered to the patient's satisfaction.     Thereasa Solo

## 2019-11-18 NOTE — Progress Notes (Signed)
Left message for Dr Denman George to call back re pt not meeting discharge criteria.

## 2019-11-19 DIAGNOSIS — I10 Essential (primary) hypertension: Secondary | ICD-10-CM | POA: Diagnosis not present

## 2019-11-19 DIAGNOSIS — C541 Malignant neoplasm of endometrium: Secondary | ICD-10-CM | POA: Diagnosis not present

## 2019-11-19 DIAGNOSIS — N736 Female pelvic peritoneal adhesions (postinfective): Secondary | ICD-10-CM | POA: Diagnosis not present

## 2019-11-19 DIAGNOSIS — Z6841 Body Mass Index (BMI) 40.0 and over, adult: Secondary | ICD-10-CM | POA: Diagnosis not present

## 2019-11-19 DIAGNOSIS — Z79899 Other long term (current) drug therapy: Secondary | ICD-10-CM | POA: Diagnosis not present

## 2019-11-19 LAB — CBC
HCT: 38.9 % (ref 36.0–46.0)
Hemoglobin: 13.2 g/dL (ref 12.0–15.0)
MCH: 29.3 pg (ref 26.0–34.0)
MCHC: 33.9 g/dL (ref 30.0–36.0)
MCV: 86.4 fL (ref 80.0–100.0)
Platelets: 273 10*3/uL (ref 150–400)
RBC: 4.5 MIL/uL (ref 3.87–5.11)
RDW: 12.8 % (ref 11.5–15.5)
WBC: 9 10*3/uL (ref 4.0–10.5)
nRBC: 0 % (ref 0.0–0.2)

## 2019-11-19 LAB — BASIC METABOLIC PANEL
Anion gap: 8 (ref 5–15)
BUN: 8 mg/dL (ref 8–23)
CO2: 25 mmol/L (ref 22–32)
Calcium: 8.8 mg/dL — ABNORMAL LOW (ref 8.9–10.3)
Chloride: 106 mmol/L (ref 98–111)
Creatinine, Ser: 0.73 mg/dL (ref 0.44–1.00)
GFR, Estimated: >60 mL/min (ref >60)
Glucose, Bld: 142 mg/dL — ABNORMAL HIGH (ref 70–99)
Potassium: 4.3 mmol/L (ref 3.5–5.1)
Sodium: 139 mmol/L (ref 135–145)

## 2019-11-19 NOTE — Discharge Summary (Signed)
Physician Discharge Summary  Patient ID: Tiffany Gilmore MRN: [id number] DOB/AGE: [birth year] 74 y.o.  Admit date: 11/18/2019 Discharge date: 11/19/2019  Admission Diagnoses: Endometrial adenocarcinoma [facility])  Discharge Diagnoses:  Principal Problem:   Endometrial adenocarcinoma [facility]) Active Problems:   Morbid obesity ([place])   Pelvic adhesions   Discharged Condition:  The patient is in good condition and stable for discharge.    Hospital Course: On 11/18/2019, the patient underwent the following: Procedure(s): XI ROBOTIC ASSISTED TOTAL HYSTERECTOMY WITH BILATERAL SALPINGO OOPHORECTOMY SENTINEL NODE BIOPSY XI ROBOTIC ASSISTED LAPAROSCOPIC LYSIS OF ADHESION. The postoperative course included moderate somnolence postop that resolved the next day requiring an overnight stay.  She was discharged to home on postoperative day 1 tolerating a regular diet, passing flatus, ambulating, voiding, pain controlled with oral medications.  Consults: None  Significant Diagnostic Studies: Labs  Treatments: surgery: see above  Discharge Exam: Blood pressure (!) 143/72, pulse 83, temperature 98.1 F (36.7 C), temperature source Oral, resp. rate 16, weight 204 lb (92.5 kg), SpO2 100 %. General appearance: alert, cooperative and no distress Resp: clear to auscultation bilaterally Cardio: regular rate and rhythm, S1, S2 normal, no murmur, click, rub or gallop GI: soft, non-tender; bowel sounds normal; no masses,  no organomegaly Extremities: extremities normal, atraumatic, no cyanosis or edema Incision/Wound: lap sites to the abdomen with dermabond without drainage and intact  Disposition: Discharge disposition: 01-Home or Self Care       Discharge Instructions    Call MD for:  difficulty breathing, headache or visual disturbances   Complete by: As directed    Call MD for:  difficulty breathing, headache or visual disturbances   Complete by: As directed    Call MD for:  extreme fatigue    Complete by: As directed    Call MD for:  extreme fatigue   Complete by: As directed    Call MD for:  hives   Complete by: As directed    Call MD for:  persistant dizziness or light-headedness   Complete by: As directed    Call MD for:  persistant dizziness or light-headedness   Complete by: As directed    Call MD for:  persistant nausea and vomiting   Complete by: As directed    Call MD for:  persistant nausea and vomiting   Complete by: As directed    Call MD for:  redness, tenderness, or signs of infection (pain, swelling, redness, odor or green/yellow discharge around incision site)   Complete by: As directed    Call MD for:  redness, tenderness, or signs of infection (pain, swelling, redness, odor or green/yellow discharge around incision site)   Complete by: As directed    Call MD for:  severe uncontrolled pain   Complete by: As directed    Call MD for:  severe uncontrolled pain   Complete by: As directed    Call MD for:  temperature >100.4   Complete by: As directed    Call MD for:  temperature >100.4   Complete by: As directed    Diet - low sodium heart healthy   Complete by: As directed    Driving Restrictions   Complete by: As directed    No driving x 72 hrs   Driving Restrictions   Complete by: As directed    No driving for 1 week.  Do not take narcotics and drive.   Increase activity slowly   Complete by: As directed    Gradually increase over 2 - 7  days   Increase activity slowly   Complete by: As directed    Lifting restrictions   Complete by: As directed    Avoid lifting > 20 lbs x 2 weeks.   Lifting restrictions   Complete by: As directed    No lifting greater than 10 lbs.   May shower / Bathe   Complete by: As directed    May walk up steps   Complete by: As directed    Other Restrictions   Complete by: As directed    Exercise in 5 - 10 days.  Swim in 7 days.  Douching is not recommended.   Sexual Activity Restrictions   Complete by: As directed     No intercourse x 6 weeks   Sexual Activity Restrictions   Complete by: As directed    No sexual activity, nothing in the vagina, for 8 weeks.     Allergies as of 11/19/2019      Reactions   Aspirin    REACTION: upsets stomach at times   Sulfa Antibiotics Rash      Medication List    TAKE these medications   amLODipine 5 MG tablet Commonly known as: NORVASC Take 5 mg by mouth daily.   aspirin 81 MG tablet Take 81 mg by mouth daily.   fish oil-omega-3 fatty acids 1000 MG capsule Take 2 g by mouth daily.   senna-docusate 8.6-50 MG tablet Commonly known as: Senokot-S Take 2 tablets by mouth at bedtime. For AFTER surgery, do not take if having diarrhea   traMADol 50 MG tablet Commonly known as: ULTRAM Take 1 tablet (50 mg total) by mouth every 6 (six) hours as needed for moderate pain. For AFTER surgery only       Follow-up Information    Everitt Amber, MD Follow up on 11/25/2019.   Specialty: Gynecologic Oncology Why: at 3:30pm will be a PHONE visit to discuss pathology results. IN PERSON visit will be on 12/10/19 at 2:15pm at the [facility] information: [address] [place] [address] [phone]               Greater than thirty minutes were spend for face to face discharge instructions and discharge orders/summary in EPIC.   Signed: Dorothyann Gibbs 11/19/2019, 9:33 AM

## 2019-11-19 NOTE — Progress Notes (Signed)
Patient and family member given discharge instructions, and all questions were answered.  Patient was taken to main exit by wheelchair.

## 2019-11-19 NOTE — Discharge Instructions (Signed)
11/18/2019  Activity: 1. Be up and out of the bed during the day.  Take a nap if needed.  You may walk up steps but be careful and use the hand rail.  Stair climbing will tire you more than you think, you may need to stop part way and rest.   2. No lifting or straining for 6 weeks.  3. No driving for 1 week.  Do Not drive if you are taking narcotic pain medicine.  4. Shower daily.  Use your regular soap and water. Pat your incisions dry afterwards; don't rub.   5. No sexual activity and nothing in the vagina for 8 weeks.  Medications:  - Take ibuprofen and tylenol first line for pain control. Take these regularly (every 6 hours) to decrease the build up of pain.  - If necessary, for severe pain not relieved by ibuprofen, take tramadol.  - While taking tramadol you should take sennakot every night to reduce the likelihood of constipation. If this causes diarrhea, stop its use.  Diet: 1. Low sodium Heart Healthy Diet is recommended.  2. It is safe to use a laxative if you have difficulty moving your bowels.   Wound Care: 1. Keep clean and dry.  Shower daily.  Reasons to call the Doctor:   Fever - Oral temperature greater than 100.4 degrees Fahrenheit  Foul-smelling vaginal discharge  Difficulty urinating  Nausea and vomiting  Increased pain at the site of the incision that is unrelieved with pain medicine.  Difficulty breathing with or without chest pain  New calf pain especially if only on one side  Sudden, continuing increased vaginal bleeding with or without clots.   Follow-up: 1. See Everitt Amber in 3 weeks.  Contacts: For questions or concerns you should contact:  Dr. Everitt Amber at [phone] After hours and on week-ends call [phone] and ask to speak to the physician on call for Gynecologic Oncology  After Your Surgery  The information in this section will tell you what to expect after your surgery, both during your stay and after you leave. You will  learn how to safely recover from your surgery. Write down any questions you have and be sure to ask your doctor or nurse.  What to Expect When you wake up after your surgery, you will be in the [place] [facility] ([facility]) or your recovery room. A nurse will be monitoring your body temperature, blood pressure, pulse, and oxygen levels. You may have a urinary catheter in your bladder to help monitor the amount of urine you are making. It should come out before you go home. You will also have compression boots on your lower legs to help your circulation. Your pain medication will be given through an IV line or in tablet form. If you are having pain, tell your nurse. Your nurse will tell you how to recover from your surgery. Below are examples of ways you can help yourself recover safely. . You will be encouraged to walk with the help of your nurse or physical therapist. We will give you medication to relieve pain. Walking helps reduce the risk for blood clots and pneumonia. It also helps to stimulate your bowels so they begin working again. . Use your incentive spirometer. This will help your lungs expand, which prevents pneumonia.   Commonly Asked Questions  Will I have pain after surgery? Yes, you will have some pain after your surgery, especially in the first few days. Your doctor and nurse will ask  you about your pain often. You will be given medication to manage your pain as needed. If your pain is not relieved, please tell your doctor or nurse. It is important to control your pain so you can cough, breathe deeply, use your incentive spirometer, and get out of bed and walk.  Will I be able to eat? Yes, you will be able to eat a regular diet or eat as tolerated. You should start with foods that are soft and easy to digest such as apple sauce and chicken noodle soup. Eat small meals frequently, and then advance to regular foods. If you experience bloating, gas, or cramps, limit high-fiber  foods, including whole grain breads and cereal, nuts, seeds, salads, fresh fruit, broccoli, cabbage, and cauliflower. Will I have pain when I am home? The length of time each person has pain or discomfort varies. You may still have some pain when you go home and will probably be taking pain medication. Follow the guidelines below. . Take your medications as directed and as needed. . Call your doctor if the medication prescribed for you doesn't relieve your pain. . Don't drive or drink alcohol while you're taking prescription pain medication. . As your incision heals, you will have less pain and need less pain medication. A mild pain reliever such as acetaminophen (Tylenol) or ibuprofen (Advil) will relieve aches and discomfort. However, large quantities of acetaminophen may be harmful to your liver. Don't take more acetaminophen than the amount directed on the bottle or as instructed by your doctor or nurse. . Pain medication should help you as you resume your normal activities. Take enough medication to do your exercises comfortably. Pain medication is most effective 30 to 45 minutes after taking it. Marland Kitchen Keep track of when you take your pain medication. Taking it when your pain first begins is more effective than waiting for the pain to get worse. Pain medication may cause constipation (having fewer bowel movements than what is normal for you).  How can I prevent constipation? . Go to the bathroom at the same time every day. Your body will get used to going at that time. . If you feel the urge to go, don't put it off. Try to use the bathroom 5 to 15 minutes after meals. . After breakfast is a good time to move your bowels. The reflexes in your colon are strongest at this time. . Exercise, if you can. Walking is an excellent form of exercise. . Drink 8 (8-ounce) glasses (2 liters) of liquids daily, if you can. Drink water, juices, soups, ice cream shakes, and other drinks that don't have caffeine.  Drinks with caffeine, such as coffee and soda, pull fluid out of the body. . Slowly increase the fiber in your diet to 25 to 35 grams per day. Fruits, vegetables, whole grains, and cereals contain fiber. If you have an ostomy or have had recent bowel surgery, check with your doctor or nurse before making any changes in your diet. . Both over-the-counter and prescription medications are available to treat constipation. Start with 1 of the following over-the-counter medications first: o Docusate sodium (Colace) 100 mg. Take ___1__ capsules _2____ times a day. This is a stool softener that causes few side effects. Don't take it with mineral oil. o Polyethylene glycol (MiraLAX) 17 grams daily. o Senna (Senokot) 2 tablets at bedtime. This is a stimulant laxative, which can cause cramping. . If you haven't had a bowel movement in 2 days, call your doctor or nurse.  Can I shower? Yes, you should shower 24 hours after your surgery. Be sure to shower every day. Taking a warm shower is relaxing and can help decrease muscle aches. Use soap when you shower and gently wash your incision. Pat the areas dry with a towel after showering, and leave your incision uncovered (unless there is drainage). Call your doctor if you see any redness or drainage from your incision. Don't take tub baths until you discuss it with your doctor at the first appointment after your surgery. How do I care for my incisions? You will have several small incisions on your abdomen. The incisions are closed with Steri-Strips or Dermabond. You may also have square white dressings on your incisions (Primapore). You can remove these in the shower 24 hours after your surgery. You should clean your incisions with soap and water. If you go home with Steri-Strips on your incision, they will loosen and may fall off by themselves. If they haven't fallen off within 10 days, you can remove them. If you go home with Dermabond over your sutures  (stitches), it will also loosen and peel off.  What are the most common symptoms after a hysterectomy? It's common for you to have some vaginal spotting or light bleeding. You should monitor this with a pad or a panty liner. If you have having heavy bleeding (bleeding through a pad or liner every 1 to 2 hours), call your doctor right away. It's also common to have some discomfort after surgery from the air that was pumped into your abdomen during surgery. To help with this, walk, drink plenty of liquids and make sure to take the stool softeners you received.  When is it safe for me to drive? You may resume driving 2 weeks after surgery, as long as you are not taking pain medication that may make you drowsy.  When can I resume sexual activity? Do not place anything in your vagina or have vaginal intercourse for 8 weeks after your surgery. Some people will need to wait longer than 8 weeks, so speak with your doctor before resuming sexual intercourse.  Will I be able to travel? Yes, you can travel. If you are traveling by plane within a few weeks after your surgery, make sure you get up and walk every hour. Be sure to stretch your legs, drink plenty of liquids, and keep your feet elevated when possible.  Will I need any supplies? Most people do not need any supplies after the surgery. In the rare case that you do need supplies, such as tubes or drains, your nurse will order them for you.  When can I return to work? The time it takes to return to work depends on the type of work you do, the type of surgery you had, and how fast your body heals. Most people can return to work about 2 to 4 weeks after the surgery.  What exercises can I do? Exercise will help you gain strength and feel better. Walking and stair climbing are excellent forms of exercise. Gradually increase the distance you walk. Climb stairs slowly, resting or stopping as needed. Ask your doctor or nurse before starting more strenuous  exercises.  When can I lift heavy objects? Most people should not lift anything heavier than 10 pounds (4.5 kilograms) for at least 4 weeks after surgery. Speak with your doctor about when you can do heavy lifting.  How can I cope with my feelings? After surgery for a serious illness, you may have  new and upsetting feelings. Many people say they felt weepy, sad, worried, nervous, irritable, and angry at one time or another. You may find that you can't control some of these feelings. If this happens, it's a good idea to seek emotional support. The first step in coping is to talk about how you feel. Family and friends can help. Your nurse, doctor, and social worker can reassure, support, and guide you. It's always a good idea to let these professionals know how you, your family, and your friends are feeling emotionally. Many resources are available to patients and their families. Whether you're in the hospital or at home, the nurses, doctors, and social workers are here to help you and your family and friends handle the emotional aspects of your illness.  When is my first appointment after surgery? Your first appointment after surgery will be 2 to 4 weeks after surgery. Your nurse will give you instructions on how to make this appointment, including the phone number to call.  What if I have other questions? If you have any questions or concerns, please talk with your doctor or nurse. You can reach them Monday through Friday from 9:00 am to 5:00 pm. After 5:00 pm, during the weekend, and on holidays, call [phone] and ask for the doctor on call for your doctor.  . Have a temperature of 101 F (38.3 C) or higher . Have pain that does not get better with pain medication . Have redness, drainage, or swelling from your incisions

## 2019-11-20 ENCOUNTER — Encounter: Payer: Self-pay | Admitting: Gynecologic Oncology

## 2019-11-20 ENCOUNTER — Telehealth: Payer: Self-pay

## 2019-11-20 NOTE — Telephone Encounter (Signed)
Call made to patient to check on her postoperatively.  Patient stated she is feeling fine, has had no issues since discharging from hospital.   Patient stated she is eating and drinking without problems.  Ms. Zwicker reports having a bowel movement this morning and is urinating without difficulty.  Patient denied any N/V/D, no fevers, no uncontrolled pain and has only taken 1/2 tramadol since arriving home.  Patient stated her incision look clean, dry and without drainage.  Patient reminded of phone visit w/ Dr. Denman George on 11/25/19 @ 330pm.  Patient given clinic number for any questions or concerns.

## 2019-11-21 ENCOUNTER — Encounter (HOSPITAL_COMMUNITY): Payer: Self-pay | Admitting: Gynecologic Oncology

## 2019-11-21 NOTE — Telephone Encounter (Signed)
Told Ms Fulco that dye is injected during the surgery to see if the it maps to the main lymph node in the the pelvis on the left and right side. This is called the sentinel lymph node. The dye did map to the sentinel lymph nodes so only thos two nodes were removed and sent to pathology. She will discuss if there was caner in the nodes or not at phone visit 11-25-19.   Patient and daughter verbalized understanding.

## 2019-11-23 ENCOUNTER — Other Ambulatory Visit: Payer: Self-pay

## 2019-11-23 ENCOUNTER — Emergency Department (HOSPITAL_COMMUNITY): Payer: Medicare Other

## 2019-11-23 ENCOUNTER — Emergency Department (HOSPITAL_COMMUNITY)
Admission: EM | Admit: 2019-11-23 | Discharge: 2019-11-23 | Disposition: A | Discharge status: Home / Self Care | Payer: Medicare Other | Attending: Emergency Medicine | Admitting: Emergency Medicine

## 2019-11-23 ENCOUNTER — Encounter (HOSPITAL_COMMUNITY): Payer: Self-pay | Admitting: Emergency Medicine

## 2019-11-23 DIAGNOSIS — Z8542 Personal history of malignant neoplasm of other parts of uterus: Secondary | ICD-10-CM | POA: Insufficient documentation

## 2019-11-23 DIAGNOSIS — I10 Essential (primary) hypertension: Secondary | ICD-10-CM | POA: Diagnosis not present

## 2019-11-23 DIAGNOSIS — Z79899 Other long term (current) drug therapy: Secondary | ICD-10-CM | POA: Diagnosis not present

## 2019-11-23 DIAGNOSIS — R072 Precordial pain: Secondary | ICD-10-CM | POA: Diagnosis not present

## 2019-11-23 DIAGNOSIS — Z853 Personal history of malignant neoplasm of breast: Secondary | ICD-10-CM | POA: Diagnosis not present

## 2019-11-23 DIAGNOSIS — R079 Chest pain, unspecified: Secondary | ICD-10-CM | POA: Diagnosis not present

## 2019-11-23 DIAGNOSIS — Z7982 Long term (current) use of aspirin: Secondary | ICD-10-CM | POA: Insufficient documentation

## 2019-11-23 LAB — CBC WITH DIFFERENTIAL/PLATELET
Abs Immature Granulocytes: 0.03 10*3/uL (ref 0.00–0.07)
Basophils Absolute: 0 10*3/uL (ref 0.0–0.1)
Basophils Relative: 1 %
Eosinophils Absolute: 0.2 10*3/uL (ref 0.0–0.5)
Eosinophils Relative: 4 %
HCT: 42.6 % (ref 36.0–46.0)
Hemoglobin: 14.1 g/dL (ref 12.0–15.0)
Immature Granulocytes: 1 %
Lymphocytes Relative: 40 %
Lymphs Abs: 2.5 10*3/uL (ref 0.7–4.0)
MCH: 29 pg (ref 26.0–34.0)
MCHC: 33.1 g/dL (ref 30.0–36.0)
MCV: 87.7 fL (ref 80.0–100.0)
Monocytes Absolute: 0.8 10*3/uL (ref 0.1–1.0)
Monocytes Relative: 13 %
Neutro Abs: 2.5 10*3/uL (ref 1.7–7.7)
Neutrophils Relative %: 41 %
Platelets: 279 10*3/uL (ref 150–400)
RBC: 4.86 MIL/uL (ref 3.87–5.11)
RDW: 13.1 % (ref 11.5–15.5)
WBC: 6.1 10*3/uL (ref 4.0–10.5)
nRBC: 0 % (ref 0.0–0.2)

## 2019-11-23 LAB — BASIC METABOLIC PANEL
Anion gap: 13 (ref 5–15)
BUN: 12 mg/dL (ref 8–23)
CO2: 26 mmol/L (ref 22–32)
Calcium: 9.5 mg/dL (ref 8.9–10.3)
Chloride: 108 mmol/L (ref 98–111)
Creatinine, Ser: 0.87 mg/dL (ref 0.44–1.00)
GFR, Estimated: >60 mL/min (ref >60)
Glucose, Bld: 98 mg/dL (ref 70–99)
Potassium: 3.5 mmol/L (ref 3.5–5.1)
Sodium: 147 mmol/L — ABNORMAL HIGH (ref 135–145)

## 2019-11-23 LAB — TROPONIN I (HIGH SENSITIVITY): Troponin I (High Sensitivity): 4 ng/L (ref <18)

## 2019-11-23 IMAGING — CT CT ABDOMEN W/ CM
2 of 5 series · 14 of 46 positions shown, 16 images · IV contrast (iopamidol)
Comparison: None.

CLINICAL DATA: RIGHT upper quadrant pain for 1 month.

EXAM:
CT ABDOMEN WITH CONTRAST
TECHNIQUE: Multidetector CT imaging of the abdomen was performed using the
standard protocol following bolus administration of intravenous
contrast.
CONTRAST:  100mL WNOKAY-5LL IOPAMIDOL (WNOKAY-5LL) INJECTION 61%
Creatinine was obtained on site at [HOSPITAL] at [REDACTED]
[HOSPITAL].
Results: Creatinine 0.8 mg/dL.

[Series 2: abd with 5.00 br40 s3 ax · axial · 0.80mm/px · z∈[+1533,+1693]mm · 11 of 38 slices shown, 13 images]
[im 3/38  soft-tissue]
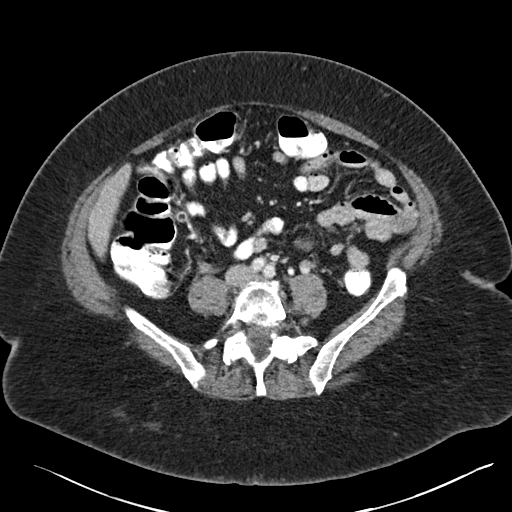
[im 3/38  bone]
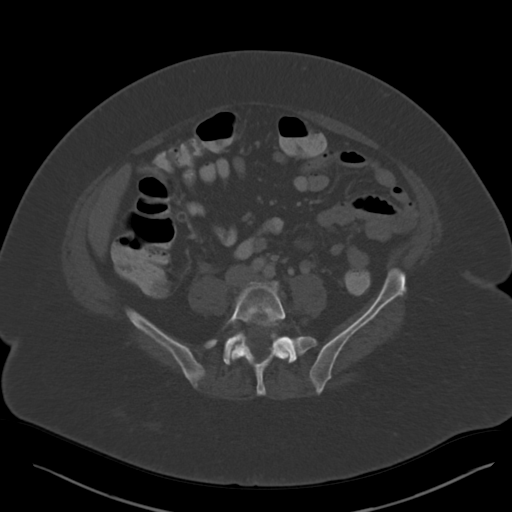
[im 6/38  soft-tissue]
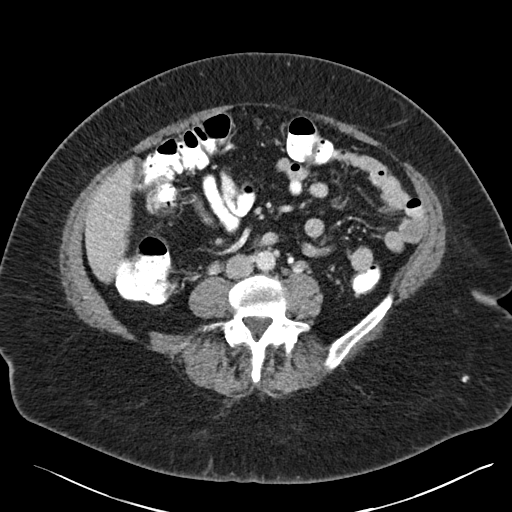
[im 8/38  soft-tissue]
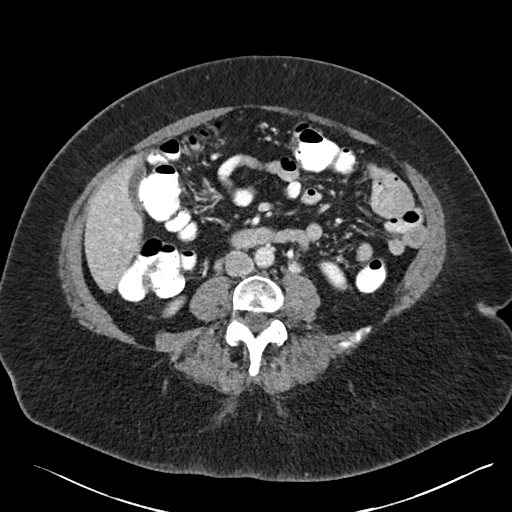
[im 14/38  soft-tissue]
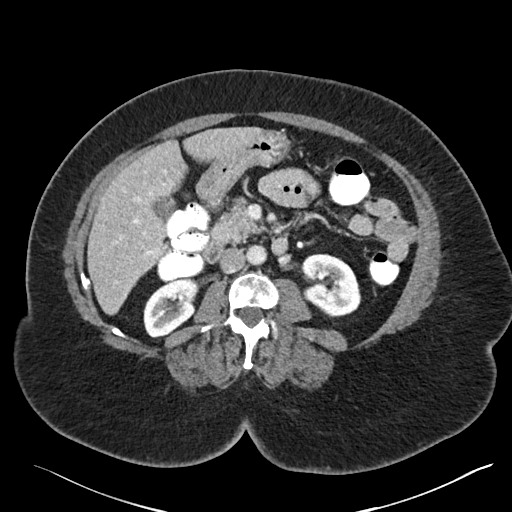
[im 16/38  soft-tissue]
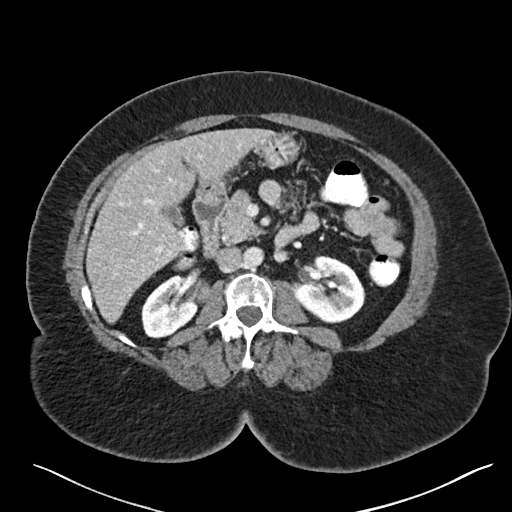
[im 19/38  soft-tissue]
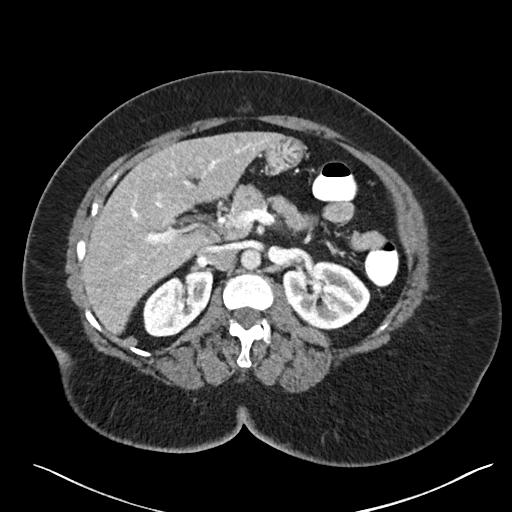
[im 22/38  soft-tissue]
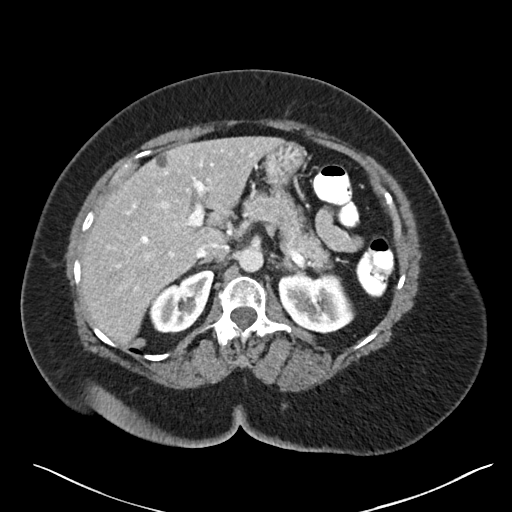
[im 24/38  soft-tissue]
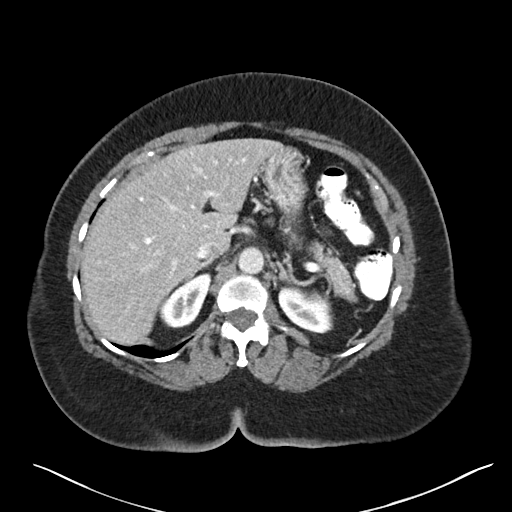
[im 30/38  soft-tissue]
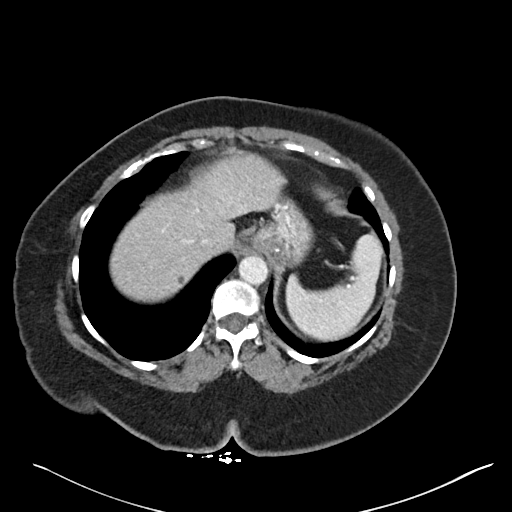
[im 30/38  bone]
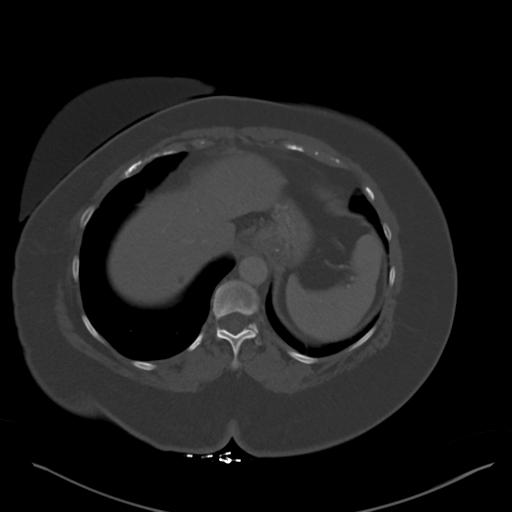
[im 32/38  soft-tissue]
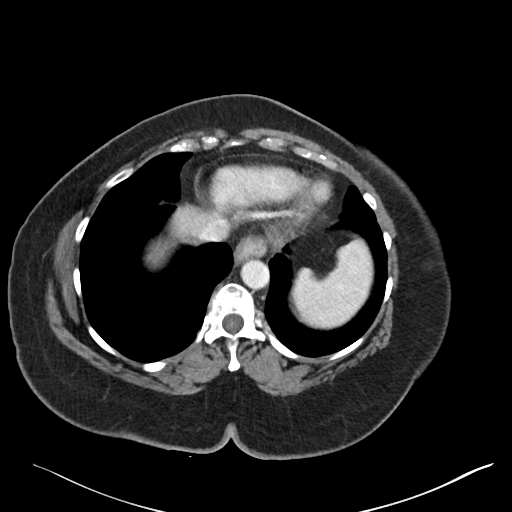
[im 35/38  soft-tissue]
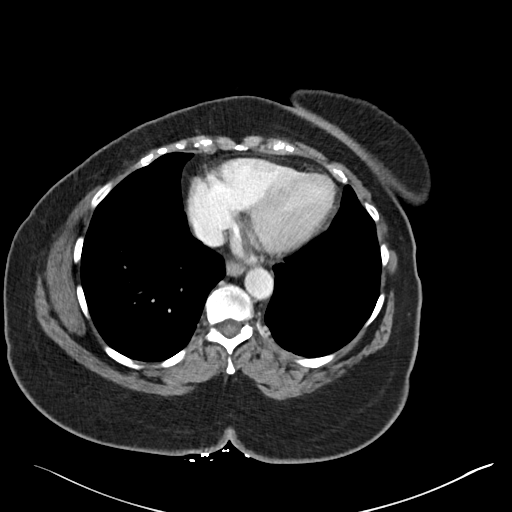

[Series 8: abd with 2.00 br40 s3 cor · coronal · 0.38mm/px · 3 of 206 slices shown]
[im 69/206  soft-tissue]
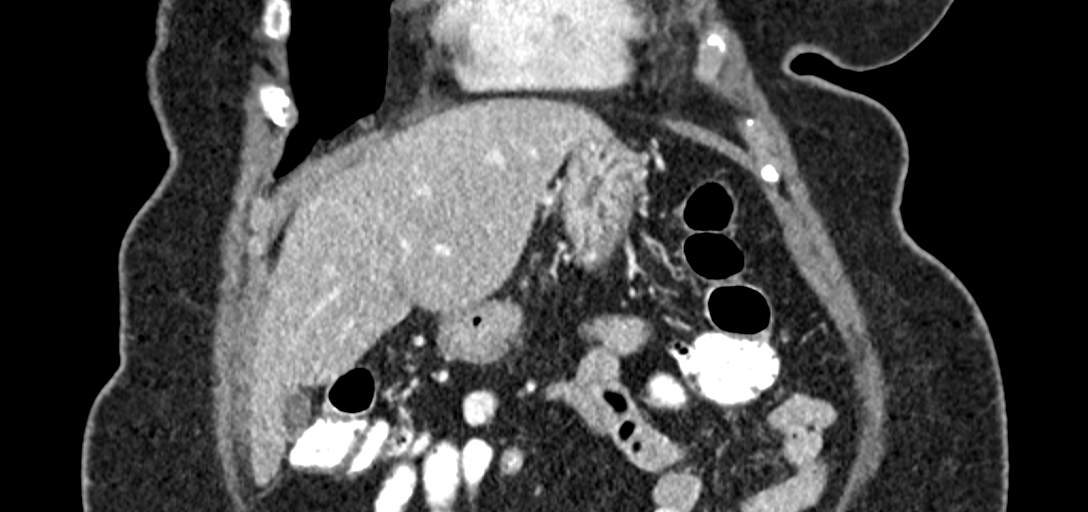
[im 92/206  soft-tissue]
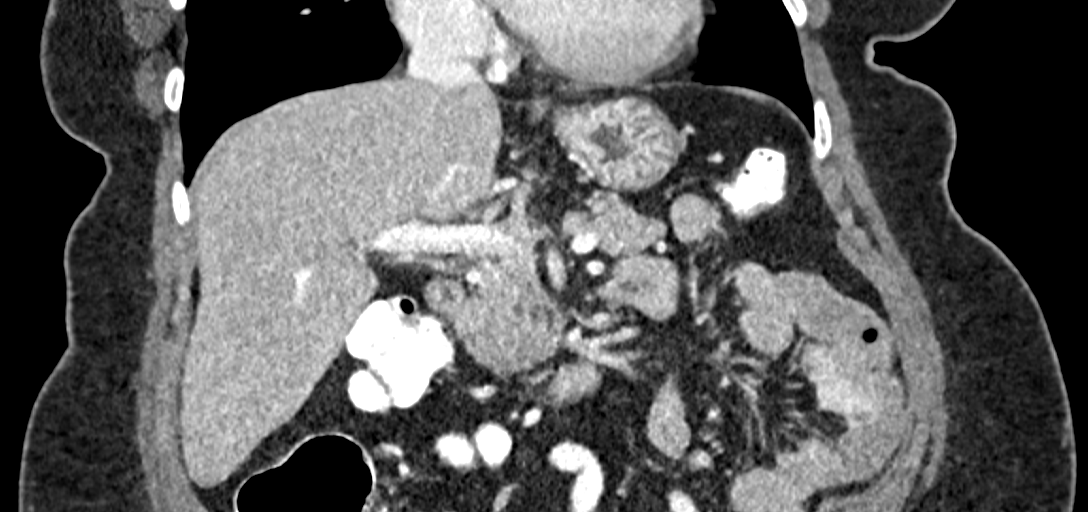
[im 114/206  soft-tissue]
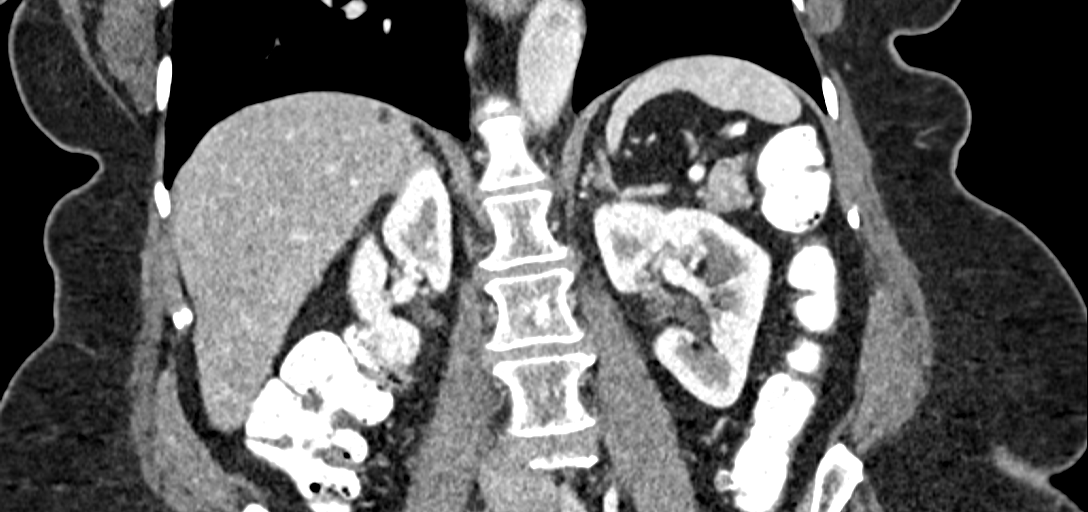

[14 of 46 positions shown; findings below may reference images not displayed]

FINDINGS: Lower chest:  Lung bases are clear.

Hepatobiliary: Low-density lesions in liver most consistent benign
hepatic cysts. Normal gallbladder. No biliary duct dilatation.

Pancreas: Normal pancreatic parenchymal intensity. No ductal
dilatation or inflammation.

Spleen: Normal spleen.

Adrenals/urinary tract: Adrenal glands and kidneys are normal.

Stomach/Bowel: Stomach and limited of the small bowel is
unremarkable

Vascular/Lymphatic: Abdominal aortic normal caliber. No
retroperitoneal periportal lymphadenopathy.

Musculoskeletal: No aggressive osseous lesion
IMPRESSION: 1. No acute abdominal findings.
2. No explanation for RIGHT upper quadrant pain.

## 2019-11-23 MED ORDER — SODIUM CHLORIDE (PF) 0.9 % IJ SOLN
INTRAMUSCULAR | Status: AC
  Filled 2019-11-23: qty 50

## 2019-11-23 MED ORDER — IOHEXOL 350 MG/ML SOLN
100.0000 mL | Freq: Once | INTRAVENOUS | Status: AC | PRN
  Administered 2019-11-23: 100 mL via INTRAVENOUS

## 2019-11-23 NOTE — ED Provider Notes (Signed)
[facility] DEPT Provider Note   CSN: [id number] Arrival date & time: 11/23/19  1335     History Chief Complaint  Patient presents with  . Chest Pain    Tiffany Gilmore is a 74 y.o. female.  Patient with h/o SVT s/p ablation presents emergency department today for evaluation of left-sided chest pain. Patient has a recent diagnosis of endometrial cancer and had a hysterectomy on 11/18/2019. Patient states that she has been doing well postop. She has been taking pain medication and she does not like the way the medications make her feel. She reports waking this morning with left-sided chest pain. She describes as a steady dull pain. Is 5/10. No shortness of breath. She does feel pain in her back. Patient has lower extremity edema at baseline, unchanged. No tenderness or soreness in her legs. No anticoagulation. She does take a baby aspirin daily. No nausea, vomiting, diarrhea. Symptoms are nonexertional. Not changed with food. She does have a history of hypertension, high cholesterol. No diabetes, smoking, or family history of MI.        Past Medical History:  Diagnosis Date  . Arthritis   . Cancer ([place])    breast R  . Cataract   . Endometrial cancer ([place])   . GERD (gastroesophageal reflux disease)   . Hypertension   . SVT (supraventricular tachycardia) ([place])    Ablation in 2002 to treat    Patient Active Problem List   Diagnosis Date Noted  . Morbid obesity ([place]) 11/18/2019  . Pelvic adhesions   . Endometrial adenocarcinoma ([place]) 10/29/2019  . COLONIC POLYPS 06/18/2007  . GERD 06/18/2007  . RECTAL BLEEDING 06/18/2007  . UNSPECIFIED DISORDER OF LIVER 06/18/2007    Past Surgical History:  Procedure Laterality Date  . ABLATION     Cardiac ablation  . BREAST SURGERY    . CATARACT EXTRACTION W/ INTRAOCULAR LENS IMPLANT    . CESAREAN SECTION    . EYE SURGERY    . ROBOTIC ASSISTED LAPAROSCOPIC LYSIS OF ADHESION N/A 11/18/2019   Procedure:  XI ROBOTIC ASSISTED LAPAROSCOPIC LYSIS OF ADHESION;  Surgeon: Everitt Amber, MD;  Location: [facility] ORS;  Service: Gynecology;  Laterality: N/A;  . ROBOTIC ASSISTED TOTAL HYSTERECTOMY WITH BILATERAL SALPINGO OOPHERECTOMY Bilateral 11/18/2019   Procedure: XI ROBOTIC ASSISTED TOTAL HYSTERECTOMY WITH BILATERAL SALPINGO OOPHORECTOMY;  Surgeon: Everitt Amber, MD;  Location: [facility];  Service: Gynecology;  Laterality: Bilateral;  . SENTINEL NODE BIOPSY N/A 11/18/2019   Procedure: SENTINEL NODE BIOPSY;  Surgeon: Everitt Amber, MD;  Location: [facility];  Service: Gynecology;  Laterality: N/A;     OB History   No obstetric history on file.     No family history on file.  Social History   Tobacco Use  . Smoking status: Never Smoker  . Smokeless tobacco: Never Used  Vaping Use  . Vaping Use: Never used  Substance Use Topics  . Alcohol use: No  . Drug use: No    Home Medications Prior to Admission medications   Medication Sig Start Date End Date Taking? Authorizing Provider  amLODipine (NORVASC) 5 MG tablet Take 5 mg by mouth daily. 08/21/19   [provider]  aspirin 81 MG tablet Take 81 mg by mouth daily.     [provider]  fish oil-omega-3 fatty acids 1000 MG capsule Take 2 g by mouth daily.     [provider]  senna-docusate (SENOKOT-S) 8.6-50 MG tablet Take 2 tablets by mouth at bedtime. For AFTER  surgery, do not take if having diarrhea 11/12/19   Cross, Lenna Sciara D, NP  traMADol (ULTRAM) 50 MG tablet Take 1 tablet (50 mg total) by mouth every 6 (six) hours as needed for moderate pain. For AFTER surgery only 11/12/19   Joylene John D, NP    Allergies    Aspirin and Sulfa antibiotics  Review of Systems   Review of Systems  Constitutional: Negative for diaphoresis and fever.  Eyes: Negative for redness.  Respiratory: Negative for cough and shortness of breath.   Cardiovascular: Positive for chest pain and leg swelling (chronic). Negative for palpitations.    Gastrointestinal: Negative for abdominal pain, nausea and vomiting.  Genitourinary: Negative for dysuria.  Musculoskeletal: Negative for back pain and neck pain.  Skin: Negative for rash.  Neurological: Negative for syncope and light-headedness.  Psychiatric/Behavioral: The patient is not nervous/anxious.     Physical Exam Updated Vital Signs BP (!) 168/85 (BP Location: Left Wrist)   Pulse 77   Temp 98.4 F (36.9 C) (Oral)   Resp (!) 21   SpO2 100%   Physical Exam Vitals and nursing note reviewed.  Constitutional:      Appearance: She is well-developed. She is not diaphoretic.  HENT:     Head: Normocephalic and atraumatic.     Mouth/Throat:     Mouth: Mucous membranes are not dry.  Eyes:     Conjunctiva/sclera: Conjunctivae normal.  Neck:     Vascular: Normal carotid pulses. No carotid bruit or JVD.     Trachea: Trachea normal. No tracheal deviation.  Cardiovascular:     Rate and Rhythm: Normal rate and regular rhythm.     Pulses: No decreased pulses.     Heart sounds: Normal heart sounds, S1 normal and S2 normal. No murmur heard.   Pulmonary:     Effort: Pulmonary effort is normal. No respiratory distress.     Breath sounds: No wheezing, rhonchi or rales.  Chest:     Chest wall: No tenderness.  Abdominal:     General: Bowel sounds are normal.     Palpations: Abdomen is soft.     Tenderness: There is no abdominal tenderness. There is no guarding or rebound.     Comments: Patient with well-healing, clean, dry, surgical scars over abdomen.  Musculoskeletal:        General: Normal range of motion.     Cervical back: Normal range of motion and neck supple. No muscular tenderness.     Right lower leg: Edema present.     Left lower leg: Edema present.     Comments: 1-2+ pitting edema bilaterally  Skin:    General: Skin is warm and dry.     Coloration: Skin is not pale.  Neurological:     Mental Status: She is alert.     ED Results / Procedures / Treatments    Labs (all labs ordered are listed, but only abnormal results are displayed) Labs Reviewed  BASIC METABOLIC PANEL - Abnormal; Notable for the following components:      Result Value   Sodium 147 (*)    All other components within normal limits  CBC WITH DIFFERENTIAL/PLATELET  TROPONIN I (HIGH SENSITIVITY)    EKG EKG Interpretation  Date/Time:  Sunday November 23 2019 13:50:33 EDT Ventricular Rate:  76 PR Interval:    QRS Duration: 80 QT Interval:  398 QTC Calculation: 448 R Axis:   57 Text Interpretation: Sinus rhythm LAE, consider biatrial enlargement Abnormal R-wave progression, early transition No  significant change since last tracing Confirmed by Theotis Burrow [phone]) on 11/23/2019 3:00:33 PM   Radiology DG Chest 2 View  Result Date: 11/23/2019 CLINICAL DATA:  LEFT sided chest pain EXAM: CHEST - 2 VIEW COMPARISON:  01/15/2013 FINDINGS: Trachea is midline. Cardiomediastinal contours and hilar structures are normal. Lungs are clear. No sign of pleural effusion. On limited assessment no acute skeletal process. IMPRESSION: No acute cardiopulmonary disease. Electronically Signed   By: Zetta Bills M.D.   On: 11/23/2019 14:32   CT Angio Chest PE W and/or Wo Contrast  Result Date: 11/23/2019 CLINICAL DATA:  Chest pain Hysterectomy 11/18/19 history endometrial cancer PE suspected, high prob CP in setting of recent surgery, CA dx. R/o PE. EXAM: CT ANGIOGRAPHY CHEST WITH CONTRAST TECHNIQUE: Multidetector CT imaging of the chest was performed using the standard protocol during bolus administration of intravenous contrast. Multiplanar CT image reconstructions and MIPs were obtained to evaluate the vascular anatomy. CONTRAST:  146mL OMNIPAQUE IOHEXOL 350 MG/ML SOLN COMPARISON:  CT abdomen 09/14/2017 FINDINGS: Cardiovascular: No filling defects within the pulmonary arteries to suggest acute pulmonary embolism. Mediastinum/Nodes: No axillary or supraclavicular adenopathy. No mediastinal or  hilar adenopathy. No pericardial fluid. Esophagus normal. Lungs/Pleura: No suspicious pulmonary nodules. Normal pleural. Airways normal. Upper Abdomen: Limited view of the liver, kidneys, pancreas are unremarkable. Normal adrenal glands. Two small benign hepatic cysts. Musculoskeletal: No aggressive osseous lesion. Review of the MIP images confirms the above findings. IMPRESSION: 1. No evidence acute pulmonary embolism. 2. No acute pulmonary parenchymal findings Electronically Signed   By: Suzy Bouchard M.D.   On: 11/23/2019 16:38    Procedures Procedures (including critical care time)  Medications Ordered in ED Medications  sodium chloride (PF) 0.9 % injection (has no administration in time range)  iohexol (OMNIPAQUE) 350 MG/ML injection 100 mL (100 mLs Intravenous Contrast Given 11/23/19 1619)    ED Course  I have reviewed the triage vital signs and the nursing notes.  Pertinent labs & imaging results that were available during my care of the patient were reviewed by me and considered in my medical decision making (see chart for details).  Patient seen and examined.   Vital signs reviewed and are as follows: BP (!) 168/85 (BP Location: Left Wrist)   Pulse 77   Temp 98.4 F (36.9 C) (Oral)   Resp (!) 21   SpO2 100%   3:34 PM Patient discussed with Dr. Rex Kras who has seen patient.   Initial work-up is reassuring. Given risk factors for PE, will r/o with chest CT. Patient is medium-risk Well's criteria and D-dimer not ordered.   4:41 PM CT results reviewed -- fortunately is negative.   Patient and family updated on results.  They are comfortable discharged home.  Patient states that her pain is somewhat better.  Discussed possible noncardiac/pulmonary etiologies.  Strongly encouraged PCP follow-up for recheck.  Patient was counseled to return with severe chest pain, especially if the pain is crushing or pressure-like and spreads to the arms, back, neck, or jaw, or if they have  sweating, nausea, or shortness of breath with the pain. They were encouraged to call 911 with these symptoms.   The patient verbalized understanding and agreed.     MDM Rules/Calculators/A&P                          Patient with chest pain, nonexertional, risk factors.  Troponin negative x1.  CT imaging performed to rule out pulmonary embolism given  recent surgery and cancer diagnosis.  This was negative.  Vital signs are reassuring, no hypoxia, no tachycardia.  Patient looks comfortable.  No emergent or life-threatening etiology of the patient's symptoms suspected today.    Final Clinical Impression(s) / ED Diagnoses Final diagnoses:  Precordial pain    Rx / DC Orders ED Discharge Orders    None       Carlisle Cater, PA-C 11/23/19 1724    Little, Wenda Overland, MD 11/26/19 [phone]

## 2019-11-23 NOTE — ED Triage Notes (Signed)
Patient c/o left chest pain today. States constant dull pain. Hysterectomy 10/19. Hx endometrial cancer.

## 2019-11-23 NOTE — Discharge Instructions (Signed)
Please read and follow all provided instructions.  Your diagnoses today include:  1. Precordial pain     Tests performed today include:  An EKG of your heart  A chest x-ray  Cardiac enzymes - a blood test for heart muscle damage, no sign of heart attack  CT scan of the chest - no signs of infection, blood clots, or other problems  Blood counts and electrolytes  Vital signs. See below for your results today.   Medications prescribed:   None  Take any prescribed medications only as directed.  Follow-up instructions: Please follow-up with your primary care provider as soon as you can for further evaluation of your symptoms.   Return instructions:  SEEK IMMEDIATE MEDICAL ATTENTION IF:  You have severe chest pain, especially if the pain is crushing or pressure-like and spreads to the arms, back, neck, or jaw, or if you have sweating, nausea (feeling sick to your stomach), or shortness of breath. THIS IS AN EMERGENCY. Don't wait to see if the pain will go away. Get medical help at once. Call 911 or 0 (operator). DO NOT drive yourself to the hospital.   Your chest pain gets worse and does not go away with rest.   You have an attack of chest pain lasting longer than usual, despite rest and treatment with the medications your caregiver has prescribed.   You wake from sleep with chest pain or shortness of breath.  You feel dizzy or faint.  You have chest pain not typical of your usual pain for which you originally saw your caregiver.   You have any other emergent concerns regarding your health.  Additional Information: Chest pain comes from many different causes. Your caregiver has diagnosed you as having chest pain that is not specific for one problem, but does not require admission.  You are at low risk for an acute heart condition or other serious illness.   Your vital signs today were: BP (!) 160/77   Pulse 81   Temp 98.4 F (36.9 C) (Oral)   Resp 14   SpO2 100%  If  your blood pressure (BP) was elevated above 135/85 this visit, please have this repeated by your doctor within one month. --------------

## 2019-11-24 LAB — SURGICAL PATHOLOGY

## 2019-11-25 ENCOUNTER — Inpatient Hospital Stay: Payer: Medicare Other | Admitting: Gynecologic Oncology

## 2019-11-30 NOTE — Progress Notes (Signed)
Cardiology Office Note:   Date:  12/02/2019  NAME:  Tiffany Gilmore    MRN: [id number] DOB:  1945-12-02   PCP:  Merrilee Seashore, MD  Cardiologist:  No primary care provider on file.   Referring MD: Merrilee Seashore, MD   Chief Complaint  Patient presents with  . Chest Pain   History of Present Illness:   Tiffany Gilmore is a 74 y.o. female with a hx of endometrial CA, GERD, HTN, obesity who is being seen today for the evaluation of chest pain at the request of Merrilee Seashore, MD. Recent hysterectomy following endometrial CA diagnosis. Was in hospital 1 week after this 11/23/2019 with CP. CT PE study negative for PE and no evidence of coronary calcium.    She reports she had the episode of chest pain on 11/23/2019.  She apparently woke up with central chest pressure.  She felt like she had indigestion.  She had a constant dull pain in the center of her chest.  It was not worse with exertion or alleviated by rest.  She was seen in the emergency room with negative work-up.  The pain resolved on its own later that night.  She reports has had no further dull achy pain in her chest.  She did not have a blood clot.  She reports over the last 1 year she can get a burning sensation in her chest with heavy exertion.  She reports it only occurs after she is walking.  She is not exercising now.  She is still working as an Optometrist.  She is also been diagnosed with high blood pressure.  She is on amlodipine 5 mg daily.  She does have some lower extremity edema which could be related to this.  BP is 140/68.  She does report that she snores and stops breathing at night per husband's report.  Her daughter was diagnosed with sleep apnea and she does wonder if she has this.  She also is excessively tired during the day.  She does have lower extremity edema after starting amlodipine.  I think this is the likely cause.  She has no increase shortness of breath.  She not that active.  She is a never smoker.  She  does not drink alcohol or use drugs.  She does have a family history of heart disease in her brother.  She is never had a heart attack or stroke.  Her EKG from the emergency room on 11/23/2019 which was reviewed by me demonstrates normal sinus rhythm heart rate 76, no acute ischemic changes or evidence of prior infarction.  She reports she would like to make sure her heart is okay.  She does take an aspirin 81 mg daily.  Her most recent lipid profile shows an HDL cholesterol of 71, LDL 168, triglycerides 55.  She is not diabetic.  Creatinine is normal.  Problem List 1. Endometrial CA -s/p total hysterectomy and bilateral salpingoophorectomy 11/18/2019 2. HTN 3. Obesity 4. SVT ablation 5.  Hyperlipidemia -HDL 71, total cholesterol 168, triglycerides 55  Past Medical History: Past Medical History:  Diagnosis Date  . Arthritis   . Cancer ([place])    breast R  . Cataract   . Endometrial cancer ([place])   . GERD (gastroesophageal reflux disease)   . Hyperlipidemia   . Hypertension   . SVT (supraventricular tachycardia) ([place])    Ablation in 2002 to treat    Past Surgical History: Past Surgical History:  Procedure Laterality Date  . ABLATION  Cardiac ablation  . BREAST SURGERY    . CATARACT EXTRACTION W/ INTRAOCULAR LENS IMPLANT    . CESAREAN SECTION    . EYE SURGERY    . ROBOTIC ASSISTED LAPAROSCOPIC LYSIS OF ADHESION N/A 11/18/2019   Procedure: XI ROBOTIC ASSISTED LAPAROSCOPIC LYSIS OF ADHESION;  Surgeon: Everitt Amber, MD;  Location: [facility];  Service: Gynecology;  Laterality: N/A;  . ROBOTIC ASSISTED TOTAL HYSTERECTOMY WITH BILATERAL SALPINGO OOPHERECTOMY Bilateral 11/18/2019   Procedure: XI ROBOTIC ASSISTED TOTAL HYSTERECTOMY WITH BILATERAL SALPINGO OOPHORECTOMY;  Surgeon: Everitt Amber, MD;  Location: [facility];  Service: Gynecology;  Laterality: Bilateral;  . SENTINEL NODE BIOPSY N/A 11/18/2019   Procedure: SENTINEL NODE BIOPSY;  Surgeon: Everitt Amber, MD;  Location: [facility];  Service:  Gynecology;  Laterality: N/A;    Current Medications: Current Meds  Medication Sig  . aspirin 81 MG tablet Take 81 mg by mouth daily.   . fish oil-omega-3 fatty acids 1000 MG capsule Take 2 g by mouth daily.   Marland Kitchen senna-docusate (SENOKOT-S) 8.6-50 MG tablet Take 2 tablets by mouth at bedtime. For AFTER surgery, do not take if having diarrhea  . [DISCONTINUED] amLODipine (NORVASC) 5 MG tablet Take 5 mg by mouth daily.     Allergies:    Aspirin and Sulfa antibiotics   Social History: Social History   Socioeconomic History  . Marital status: Married    Spouse name: Not on file  . Number of children: 2  . Years of education: Not on file  . Highest education level: Not on file  Occupational History  . Occupation: working as Catering manager  . Smoking status: Never Smoker  . Smokeless tobacco: Never Used  Vaping Use  . Vaping Use: Never used  Substance and Sexual Activity  . Alcohol use: No  . Drug use: No  . Sexual activity: Yes  Other Topics Concern  . Not on file  Social History Narrative  . Not on file   Social Determinants of Health   Financial Resource Strain:   . Difficulty of Paying Living Expenses: Not on file  Food Insecurity:   . Worried About Charity fundraiser in the Last Year: Not on file  . Ran Out of Food in the Last Year: Not on file  Transportation Needs:   . Lack of Transportation (Medical): Not on file  . Lack of Transportation (Non-Medical): Not on file  Physical Activity:   . Days of Exercise per Week: Not on file  . Minutes of Exercise per Session: Not on file  Stress:   . Feeling of Stress : Not on file  Social Connections:   . Frequency of Communication with Friends and Family: Not on file  . Frequency of Social Gatherings with Friends and Family: Not on file  . Attends Religious Services: Not on file  . Active Member of Clubs or Organizations: Not on file  . Attends Archivist Meetings: Not on file  . Marital Status:  Not on file     Family History: The patient's family history includes Heart disease in her brother.  ROS:   All other ROS reviewed and negative. Pertinent positives noted in the HPI.     EKGs/Labs/Other Studies Reviewed:   The following studies were personally reviewed by me today:  EKG: EKG from the emergency room dated 11/23/2019 was reviewed by myself.  This demonstrates normal sinus rhythm, heart rate 76, no acute ischemic changes, no evidence of prior infarction  CT PE study  dated 11/23/2019 demonstrates no acute pulmonary embolism and no evidence of coronary calcification  Recent Labs: 11/10/2019: ALT 16 11/23/2019: BUN 12; Creatinine, Ser 0.87; Hemoglobin 14.1; Platelets 279; Potassium 3.5; Sodium 147   Recent Lipid Panel No results found for: CHOL, TRIG, HDL, CHOLHDL, VLDL, LDLCALC, LDLDIRECT  Physical Exam:   VS:  BP 140/68   Pulse 84   Ht 4' 11"  (1.499 m)   Wt 203 lb 9.6 oz (92.4 kg)   BMI 41.12 kg/m    Wt Readings from Last 3 Encounters:  12/02/19 203 lb 9.6 oz (92.4 kg)  11/18/19 204 lb (92.5 kg)  11/12/19 204 lb (92.5 kg)    General: Well nourished, well developed, in no acute distress Heart: Atraumatic, normal size  Eyes: PEERLA, EOMI  Neck: Supple, no JVD Endocrine: No thryomegaly Cardiac: Normal S1, S2; RRR; no murmurs, rubs, or gallops Lungs: Clear to auscultation bilaterally, no wheezing, rhonchi or rales  Abd: Soft, nontender, no hepatomegaly  Ext: No edema, pulses 2+ Musculoskeletal: No deformities, BUE and BLE strength normal and equal Skin: Warm and dry, no rashes   Neuro: Alert and oriented to person, place, time, and situation, CNII-XII grossly intact, no focal deficits  Psych: Normal mood and affect   ASSESSMENT:   MAIMUNA LEAMAN is a 74 y.o. female who presents for the following: 1. Other chest pain   2. Primary hypertension   3. Obesity, morbid, BMI 40.0-49.9 ([place])   4. Snoring   5. Fatigue, unspecified type   6. Leg edema   7.  Precordial pain     PLAN:   1. Other chest pain -She had acute onset dull achy chest pain that was constant 1 week after surgery.  She woke up with this.  This pain is atypical.  She had a negative PE study.  Thyroid studies are normal recently.  She has no evidence of infarction on her EKG.  Overall I think this could be sleep apnea related.  However, she describes she does get some exertional burning in her chest.  This can occur with heavy exertion.  Is been going on for 1 year.  Symptoms could possibly related to stable angina.  I recommended coronary CTA for further evaluation.  This should be a good study as she has no evidence of coronary calcification on her recent CT PE study.  She will take 100 mg of metoprolol tartrate twice before the scan.  2. Primary hypertension -BP 140/68.  Has been elevated in the past.  Her log shows her blood pressure is persistently in the 1 50-1 60 range.  This could also be contributing to her symptoms.  We will stop her amlodipine due to leg swelling.  Start losartan 50 mg daily.  I also want to start her on HCTZ 12.5 mg daily.  I think given how high her blood pressure has been she deserves 2 agents. -She does snore and has excessive daytime fatigue.  Given her elevated blood pressure she needs a sleep study.  We will set her up with a home sleep study.  3. Obesity, morbid, BMI 40.0-49.9 ([place]) 4. Snoring 5. Fatigue, unspecified type -She reports excessive daytime fatigue and snoring.  Reports from her husband say she stops breathing.  Likely has sleep apnea.  Home sleep study.  6. Leg edema -I suspect this is related to amlodipine.  We will start losartan 50 mg daily and HCTZ 12.5 mg daily.  I think this will be a good regimen for her.  Disposition: No follow-ups on file.  Medication Adjustments/Labs and Tests Ordered: Current medicines are reviewed at length with the patient today.  Concerns regarding medicines are outlined above.  Orders Placed This  Encounter  Procedures  . CT CORONARY MORPH W/CTA COR W/SCORE W/CA W/CM &/OR WO/CM  . CT CORONARY FRACTIONAL FLOW RESERVE DATA PREP  . CT CORONARY FRACTIONAL FLOW RESERVE FLUID ANALYSIS  . Home sleep test   Meds ordered this encounter  Medications  . hydrochlorothiazide (MICROZIDE) 12.5 MG capsule    Sig: Take 1 capsule (12.5 mg total) by mouth daily.    Dispense:  90 capsule    Refill:  3  . metoprolol tartrate (LOPRESSOR) 100 MG tablet    Sig: Take 100 mg 2 hours before Coronary CT    Dispense:  1 tablet    Refill:  0  . losartan (COZAAR) 50 MG tablet    Sig: Take 1 tablet (50 mg total) by mouth daily.    Dispense:  90 tablet    Refill:  3    Patient Instructions  Medication Instructions:  Stop Amlodipine Start Losartan 50 mg daily Start HCTZ 12.5 mg daily Continue all other medications *If you need a refill on your cardiac medications before your next appointment, please call your pharmacy*   Lab Work: None ordered   Testing/Procedures: Coronary CT   Will be scheduled after approved by insurance    Follow instructions below  Home Sleep Study  Will be mailed  Follow-Up: At [facility], you and your health needs are our priority.  As part of our continuing mission to provide you with exceptional heart care, we have created designated Provider Care Teams.  These Care Teams include your primary Cardiologist (physician) and Advanced Practice Providers (APPs -  Physician Assistants and Nurse Practitioners) who all work together to provide you with the care you need, when you need it.  We recommend signing up for the patient portal called "MyChart".  Sign up information is provided on this After Visit Summary.  MyChart is used to connect with patients for Virtual Visits (Telemedicine).  Patients are able to view lab/test results, encounter notes, upcoming appointments, etc.  Non-urgent messages can be sent to your provider as well.   To learn more about what you can do with  MyChart, go to [email or web address].    Your next appointment:  Tuesday 03/02/20 at 1:20 pm   The format for your next appointment: Office    Provider:  Dr.O'Neal     Your cardiac CT will be scheduled at one of the below locations:   [facility] [address] [place], [place] [address] [phone]  [place] [address] [place], [place] [address] [phone]  If scheduled at [facility], please arrive at the [facility] main entrance of [facility] 30 minutes prior to test start time. Proceed to the [facility] Radiology Department (first floor) to check-in and test prep.  If scheduled at [facility], please arrive 15 mins early for check-in and test prep.  Please follow these instructions carefully (unless otherwise directed):    On the Night Before the Test: . Be sure to Drink plenty of water. . Do not consume any caffeinated/decaffeinated beverages or chocolate 12 hours prior to your test. . Do not take any antihistamines 12 hours prior to your test.   On the Day of the Test: . Drink plenty of water. Do not  drink any water within one hour of the test. . Do not eat any food 4 hours prior to the test. . You may take your regular medications prior to the test.  . Take metoprolol 100 mg two hours prior to test. . HOLD Hydrochlorothiazide morning of the test. . FEMALES- please wear underwire-free bra if available         After the Test: . Drink plenty of water. . After receiving IV contrast, you may experience a mild flushed feeling. This is normal. . On occasion, you may experience a mild rash up to 24 hours after the test. This is not dangerous. If this occurs, you can take Benadryl 25 mg and increase your fluid intake. . If you experience trouble breathing, this can be serious. If it is severe call 911 IMMEDIATELY. If it is mild, please call our  office.    Once we have confirmed authorization from your insurance company, we will call you to set up a date and time for your test. Based on how quickly your insurance processes prior authorizations requests, please allow up to 4 weeks to be contacted for scheduling your Cardiac CT appointment. Be advised that routine Cardiac CT appointments could be scheduled as many as 8 weeks after your provider has ordered it.  For non-scheduling related questions, please contact the cardiac imaging nurse navigator should you have any questions/concerns: Marchia Bond, Cardiac Imaging Nurse Navigator Burley Saver, Interim Cardiac Imaging Nurse [place] and Vascular Services Direct Office Dial: [phone]   For scheduling needs, including cancellations and rescheduling, please call Vivien Rota at [phone], option 3.         Signed, Addison Naegeli. Audie Box, [place]  [address], [place], [place] [address] [phone]  12/02/2019 4:03 PM

## 2019-12-02 ENCOUNTER — Encounter: Payer: Self-pay | Admitting: Cardiovascular Disease

## 2019-12-02 ENCOUNTER — Other Ambulatory Visit: Payer: Self-pay

## 2019-12-02 ENCOUNTER — Ambulatory Visit (INDEPENDENT_AMBULATORY_CARE_PROVIDER_SITE_OTHER): Payer: Medicare Other | Admitting: Cardiovascular Disease

## 2019-12-02 VITALS — BP 140/68 | HR 84 | Ht 59.0 in | Wt 203.6 lb

## 2019-12-02 DIAGNOSIS — R600 Localized edema: Secondary | ICD-10-CM | POA: Diagnosis not present

## 2019-12-02 DIAGNOSIS — R0789 Other chest pain: Secondary | ICD-10-CM

## 2019-12-02 DIAGNOSIS — R5383 Other fatigue: Secondary | ICD-10-CM | POA: Diagnosis not present

## 2019-12-02 DIAGNOSIS — R0683 Snoring: Secondary | ICD-10-CM

## 2019-12-02 DIAGNOSIS — I10 Essential (primary) hypertension: Secondary | ICD-10-CM | POA: Diagnosis not present

## 2019-12-02 DIAGNOSIS — R072 Precordial pain: Secondary | ICD-10-CM

## 2019-12-02 MED ORDER — METOPROLOL TARTRATE 100 MG PO TABS: ORAL_TABLET | ORAL | 0 refills | Status: DC

## 2019-12-02 MED ORDER — HYDROCHLOROTHIAZIDE 12.5 MG PO CAPS: 12.5000 mg | ORAL_CAPSULE | Freq: Every day | ORAL | 3 refills | Status: DC

## 2019-12-02 MED ORDER — LOSARTAN POTASSIUM 50 MG PO TABS: 50.0000 mg | ORAL_TABLET | Freq: Every day | ORAL | 3 refills | Status: DC

## 2019-12-02 NOTE — Patient Instructions (Addendum)
Medication Instructions:  Stop Amlodipine Start Losartan 50 mg daily Start HCTZ 12.5 mg daily Continue all other medications *If you need a refill on your cardiac medications before your next appointment, please call your pharmacy*   Lab Work: None ordered   Testing/Procedures: Coronary CT   Will be scheduled after approved by insurance    Follow instructions below  Home Sleep Study  Will be mailed  Follow-Up: At [facility], you and your health needs are our priority.  As part of our continuing mission to provide you with exceptional heart care, we have created designated Provider Care Teams.  These Care Teams include your primary Cardiologist (physician) and Advanced Practice Providers (APPs -  Physician Assistants and Nurse Practitioners) who all work together to provide you with the care you need, when you need it.  We recommend signing up for the patient portal called "MyChart".  Sign up information is provided on this After Visit Summary.  MyChart is used to connect with patients for Virtual Visits (Telemedicine).  Patients are able to view lab/test results, encounter notes, upcoming appointments, etc.  Non-urgent messages can be sent to your provider as well.   To learn more about what you can do with MyChart, go to [email or web address].    Your next appointment:  Tuesday 03/02/20 at 1:20 pm   The format for your next appointment: Office    Provider:  Dr.O'Neal     Your cardiac CT will be scheduled at one of the below locations:   [facility] [address] [place], [place] [address] [phone]  [place] [address] [place], [place] [address] [phone]  If scheduled at [facility], please arrive at the [facility] main entrance of [facility] 30 minutes prior to test start time. Proceed to the [facility] Radiology Department (first floor) to check-in and test  prep.  If scheduled at [facility], please arrive 15 mins early for check-in and test prep.  Please follow these instructions carefully (unless otherwise directed):    On the Night Before the Test: . Be sure to Drink plenty of water. . Do not consume any caffeinated/decaffeinated beverages or chocolate 12 hours prior to your test. . Do not take any antihistamines 12 hours prior to your test.   On the Day of the Test: . Drink plenty of water. Do not drink any water within one hour of the test. . Do not eat any food 4 hours prior to the test. . You may take your regular medications prior to the test.  . Take metoprolol 100 mg two hours prior to test. . HOLD Hydrochlorothiazide morning of the test. . FEMALES- please wear underwire-free bra if available         After the Test: . Drink plenty of water. . After receiving IV contrast, you may experience a mild flushed feeling. This is normal. . On occasion, you may experience a mild rash up to 24 hours after the test. This is not dangerous. If this occurs, you can take Benadryl 25 mg and increase your fluid intake. . If you experience trouble breathing, this can be serious. If it is severe call 911 IMMEDIATELY. If it is mild, please call our office.    Once we have confirmed authorization from your insurance company, we will call you to set up a date and time for your test. Based on how quickly your insurance processes prior authorizations requests,  please allow up to 4 weeks to be contacted for scheduling your Cardiac CT appointment. Be advised that routine Cardiac CT appointments could be scheduled as many as 8 weeks after your provider has ordered it.  For non-scheduling related questions, please contact the cardiac imaging nurse navigator should you have any questions/concerns: Marchia Bond, Cardiac Imaging Nurse Navigator Burley Saver, Interim Cardiac Imaging Nurse [place] and Vascular  Services Direct Office Dial: [phone]   For scheduling needs, including cancellations and rescheduling, please call Vivien Rota at [phone], option 3.

## 2019-12-03 ENCOUNTER — Telehealth: Payer: Self-pay | Admitting: Cardiovascular Disease

## 2019-12-03 NOTE — Telephone Encounter (Signed)
Spoke with the patient. She will stop HCTZ and continue Losartan. Advise to monitor BP for one week and call back with readings.

## 2019-12-03 NOTE — Telephone Encounter (Signed)
    Pt c/o medication issue:  1. Name of Medication: hydrochlorothiazide (MICROZIDE) 12.5 MG capsule  2. How are you currently taking this medication (dosage and times per day)? Take 1 capsule (12.5 mg total) by mouth daily.  3. Are you having a reaction (difficulty breathing--STAT)?   4. What is your medication issue? Pt said she is allergic to sulfur and would like to know if Dr. Audie Box would prescribed a different medication

## 2019-12-03 NOTE — Telephone Encounter (Signed)
Why don't we cancel the HCTZ and see how her BP does with losartan. I stopped her amlodipine which may be the cause of the swelling.   [place] T. Audie Box, [place]  [address] [place], [place] [address] [phone]  12:30 PM

## 2019-12-04 NOTE — Telephone Encounter (Signed)
Thanks

## 2019-12-09 ENCOUNTER — Telehealth: Payer: Self-pay | Admitting: Cardiovascular Disease

## 2019-12-09 NOTE — Telephone Encounter (Signed)
No PA required, waiting on sleep lab to call back to schedule.

## 2019-12-10 ENCOUNTER — Other Ambulatory Visit: Payer: Self-pay

## 2019-12-10 ENCOUNTER — Encounter: Payer: Self-pay | Admitting: Gynecologic Oncology

## 2019-12-10 ENCOUNTER — Inpatient Hospital Stay: Payer: Medicare Other | Attending: Gynecologic Oncology | Admitting: Gynecologic Oncology

## 2019-12-10 VITALS — BP 154/67 | HR 77 | Temp 98.3°F | Resp 18 | Ht 59.0 in | Wt 203.0 lb

## 2019-12-10 DIAGNOSIS — I471 Supraventricular tachycardia: Secondary | ICD-10-CM | POA: Insufficient documentation

## 2019-12-10 DIAGNOSIS — Z90710 Acquired absence of both cervix and uterus: Secondary | ICD-10-CM | POA: Diagnosis not present

## 2019-12-10 DIAGNOSIS — Z90722 Acquired absence of ovaries, bilateral: Secondary | ICD-10-CM | POA: Insufficient documentation

## 2019-12-10 DIAGNOSIS — Z79899 Other long term (current) drug therapy: Secondary | ICD-10-CM | POA: Diagnosis not present

## 2019-12-10 DIAGNOSIS — E785 Hyperlipidemia, unspecified: Secondary | ICD-10-CM | POA: Diagnosis not present

## 2019-12-10 DIAGNOSIS — I10 Essential (primary) hypertension: Secondary | ICD-10-CM | POA: Insufficient documentation

## 2019-12-10 DIAGNOSIS — K219 Gastro-esophageal reflux disease without esophagitis: Secondary | ICD-10-CM | POA: Insufficient documentation

## 2019-12-10 DIAGNOSIS — M1990 Unspecified osteoarthritis, unspecified site: Secondary | ICD-10-CM | POA: Diagnosis not present

## 2019-12-10 DIAGNOSIS — Z7189 Other specified counseling: Secondary | ICD-10-CM

## 2019-12-10 DIAGNOSIS — Z7982 Long term (current) use of aspirin: Secondary | ICD-10-CM | POA: Insufficient documentation

## 2019-12-10 DIAGNOSIS — C541 Malignant neoplasm of endometrium: Secondary | ICD-10-CM | POA: Diagnosis not present

## 2019-12-10 NOTE — Telephone Encounter (Signed)
Called patient and told her she was scheduled for Thursday, Dec 2 at 11:15 am, to expect the info packet and gave her their number to call for questions.

## 2019-12-10 NOTE — Progress Notes (Signed)
Follow-up Note: Gyn-Onc  Consult was requested by Dr. Wilhelmenia Blase for the evaluation of Tiffany Gilmore 74 y.o. female  CC:  Chief Complaint  Patient presents with  . Endometrial adenocarcinoma [facility])    Assessment/Plan:  Tiffany. Tiffany Gilmore  is a 74 y.o.  year old P3 with stage IA grade 1 endometrial cancer (MMRd, MLH1 hypermethylation present).  Pathology revealed low risk factors for recurrence, therefore no adjuvant therapy is recommended according to NCCN guidelines.  I discussed risk for recurrence and typical symptoms encouraged her to notify us of these should they develop between visits.  I recommend she have follow-up every 6 months for 5 years in accordance with NCCN guidelines. Those visits should include symptom assessment, physical exam and pelvic examination. Pap smears are not indicated or recommended in the routine surveillance of endometrial cancer.  HPI: Tiffany Gilmore is a 74 year old P3 who was seen in consultation at the request of Dr Wilhelmenia Blase for evaluation and treatment of grade 1 endometrial cancer.   Her symptoms began in approximately 2020 with postmenopausal spotting.  On October 11, 2019 she developed heavier vaginal bleeding which was alarming and a change noted by the patient and therefore she presented to the emergency department on that same day.  The emergency department performed a transvaginal ultrasound scan on October 12, 2019 which revealed a uterus measuring 7.6 x 3.8 x 5 cm with an endometrial thickness of 17 mm.  The right and left ovaries were not visualized..  She did not have a gynecologist with whom to follow-up and therefore was scheduled to see Dr Wilhelmenia Blase for a pelvic examination on October 22, 2019.  Work-up of symptoms included an endometrial biopsy. Endometrial sampling with Pipelle in the office was performed on 10/22/2019 and showed endometrioid adenocarcinoma with squamous differentiation, FIGO grade 1.   Interval Hx:  On 11/18/2019 she  underwent robotic assisted total hysterectomy with BSO and sentinel lymph node biopsy and pelvic adhesiolysis. Intraoperative findings were significant for a 6 cm uterus, normal tubes and ovaries, dense omental adhesions to the anterior abdominal wall into the uterus.  No gross extrauterine disease. Surgery was uncomplicated.  Final pathology revealed a FIGO grade 1 endometrioid adenocarcinoma with 5 of 14 mm myometrial invasion no LVSI.  MMR deficiencies in MLH1 and PMS2.  MLH1 hyper methylation present therefore Lynch testing not ordered.  Since surgery she has done well with no complications.    Current Meds:  Outpatient Encounter Medications as of 12/10/2019  Medication Sig  . aspirin 81 MG tablet Take 81 mg by mouth daily.   . fish oil-omega-3 fatty acids 1000 MG capsule Take 2 g by mouth daily.   Marland Kitchen losartan (COZAAR) 50 MG tablet Take 1 tablet (50 mg total) by mouth daily.  Marland Kitchen senna-docusate (SENOKOT-S) 8.6-50 MG tablet Take 2 tablets by mouth at bedtime. For AFTER surgery, do not take if having diarrhea (Patient not taking: Reported on 12/10/2019)  . traMADol (ULTRAM) 50 MG tablet Take 1 tablet (50 mg total) by mouth every 6 (six) hours as needed for moderate pain. For AFTER surgery only (Patient not taking: Reported on 12/02/2019)  . [DISCONTINUED] metoprolol tartrate (LOPRESSOR) 100 MG tablet Take 100 mg 2 hours before Coronary CT   No facility-administered encounter medications on file as of 12/10/2019.    Allergy:  Allergies  Allergen Reactions  . Aspirin     REACTION: upsets stomach at times  . Sulfa Antibiotics Rash    Social Hx:   Social  History   Socioeconomic History  . Marital status: Married    Spouse name: Not on file  . Number of children: 2  . Years of education: Not on file  . Highest education level: Not on file  Occupational History  . Occupation: working as Catering manager  . Smoking status: Never Smoker  . Smokeless tobacco: Never Used  Vaping  Use  . Vaping Use: Never used  Substance and Sexual Activity  . Alcohol use: No  . Drug use: No  . Sexual activity: Yes  Other Topics Concern  . Not on file  Social History Narrative  . Not on file   Social Determinants of Health   Financial Resource Strain:   . Difficulty of Paying Living Expenses: Not on file  Food Insecurity:   . Worried About Charity fundraiser in the Last Year: Not on file  . Ran Out of Food in the Last Year: Not on file  Transportation Needs:   . Lack of Transportation (Medical): Not on file  . Lack of Transportation (Non-Medical): Not on file  Physical Activity:   . Days of Exercise per Week: Not on file  . Minutes of Exercise per Session: Not on file  Stress:   . Feeling of Stress : Not on file  Social Connections:   . Frequency of Communication with Friends and Family: Not on file  . Frequency of Social Gatherings with Friends and Family: Not on file  . Attends Religious Services: Not on file  . Active Member of Clubs or Organizations: Not on file  . Attends Archivist Meetings: Not on file  . Marital Status: Not on file  Intimate Partner Violence:   . Fear of Current or Ex-Partner: Not on file  . Emotionally Abused: Not on file  . Physically Abused: Not on file  . Sexually Abused: Not on file    Past Surgical Hx:  Past Surgical History:  Procedure Laterality Date  . ABLATION     Cardiac ablation  . BREAST SURGERY    . CATARACT EXTRACTION W/ INTRAOCULAR LENS IMPLANT    . CESAREAN SECTION    . EYE SURGERY    . ROBOTIC ASSISTED LAPAROSCOPIC LYSIS OF ADHESION N/A 11/18/2019   Procedure: XI ROBOTIC ASSISTED LAPAROSCOPIC LYSIS OF ADHESION;  Surgeon: Everitt Amber, MD;  Location: [facility];  Service: Gynecology;  Laterality: N/A;  . ROBOTIC ASSISTED TOTAL HYSTERECTOMY WITH BILATERAL SALPINGO OOPHERECTOMY Bilateral 11/18/2019   Procedure: XI ROBOTIC ASSISTED TOTAL HYSTERECTOMY WITH BILATERAL SALPINGO OOPHORECTOMY;  Surgeon: Everitt Amber, MD;   Location: [facility];  Service: Gynecology;  Laterality: Bilateral;  . SENTINEL NODE BIOPSY N/A 11/18/2019   Procedure: SENTINEL NODE BIOPSY;  Surgeon: Everitt Amber, MD;  Location: [facility];  Service: Gynecology;  Laterality: N/A;    Past Medical Hx:  Past Medical History:  Diagnosis Date  . Arthritis   . Cancer ([place])    breast R  . Cataract   . Endometrial cancer ([place])   . GERD (gastroesophageal reflux disease)   . Hyperlipidemia   . Hypertension   . SVT (supraventricular tachycardia) ([place])    Ablation in 2002 to treat    Past Gynecological History:  See HPI No LMP recorded. Patient is postmenopausal.  Family Hx:  Family History  Problem Relation Age of Onset  . Heart disease Brother     Review of Systems:  Constitutional  Feels well,   ENT Normal appearing ears and nares bilaterally Skin/Breast  No  rash, sores, jaundice, itching, dryness Cardiovascular  No chest pain, shortness of breath, or edema  Pulmonary  No cough or wheeze.  Gastro Intestinal  No nausea, vomitting, or diarrhoea. No bright red blood per rectum, no abdominal pain, change in bowel movement, or constipation.  Genito Urinary  No frequency, urgency, dysuria, + postmenopausal bleeding Musculo Skeletal  No myalgia, arthralgia, joint swelling or pain  Neurologic  No weakness, numbness, change in gait,  Psychology  No depression, anxiety, insomnia.   Vitals:  Blood pressure (!) 154/67, pulse 77, temperature 98.3 F (36.8 C), temperature source Oral, resp. rate 18, height _0  (1.499 m), weight 203 lb (92.1 kg), SpO2 100 %.  Physical Exam: WD in NAD Neck  Supple NROM, without any enlargements.  Lymph Node Survey No cervical supraclavicular or inguinal adenopathy Cardiovascular  Pulse normal rate, regularity and rhythm. S1 and S2 normal.  Lungs  Clear to auscultation bilateraly, without wheezes/crackles/rhonchi. Good air movement.  Skin  No rash/lesions/breakdown  Psychiatry  Alert and  oriented to person, place, and time  Abdomen  Normoactive bowel sounds, abdomen soft, non-tender and obese without evidence of hernia. Vertical midline incision. Short waisted, protuberant abdomen. Well healed incisions. Back No CVA tenderness Genito Urinary:  Vaginal mucosa with mild gapping of mucosa at vaginal cuff centrally, suture material present, no dehiscence. No lesions or palpable masses.  Rectal  deferred Extremities  No bilateral cyanosis, clubbing or edema.   30 minutes of direct face to face counseling time was spent with the patient. This included discussion about prognosis, therapy recommendations and postoperative side effects and are beyond the scope of routine postoperative care.   Thereasa Solo, MD  12/10/2019, 2:52 PM

## 2019-12-10 NOTE — Patient Instructions (Signed)
Your cancer was a stage 1 cancer. No additional treatments are recommended.  Dr Denman George is recommending 6 monthly gynecologic check ups.  Please notify Dr Denman George at phone number [phone] if you notice vaginal bleeding, new pelvic or abdominal pains, bloating, feeling full easy, or a change in bladder or bowel function.    Please contact Dr Serita Grit office (at [phone]) in January, 2022 to request an appointment with her for May, 2022.

## 2019-12-11 ENCOUNTER — Telehealth (HOSPITAL_COMMUNITY): Payer: Self-pay | Admitting: Emergency Medicine

## 2019-12-11 NOTE — Telephone Encounter (Signed)
Attempted to call patient regarding upcoming cardiac CT appointment. °Left message on voicemail with name and callback number °Robie Mcniel RN Navigator Cardiac Imaging °[facility] °[phone] Office °[phone] Cell ° °

## 2019-12-11 NOTE — Telephone Encounter (Signed)
Pt returning phone call regarding upcoming cardiac imaging study; pt verbalizes understanding of appt date/time, parking situation and where to check in, pre-test NPO status and medications ordered, and verified current allergies; name and call back number provided for further questions should they arise Marchia Bond RN Navigator Cardiac Imaging [facility] [phone] office [phone] cell   Pt does not take HCTZ anymore. Will take PO metoprolol 2 hr prior to scan. Husband driving . Clarise Cruz

## 2019-12-12 ENCOUNTER — Ambulatory Visit (HOSPITAL_COMMUNITY)
Admission: RE | Admit: 2019-12-12 | Discharge: 2019-12-12 | Disposition: A | Discharge status: Home / Self Care | Payer: Medicare Other | Source: Ambulatory Visit | Attending: Cardiovascular Disease | Admitting: Cardiovascular Disease

## 2019-12-12 ENCOUNTER — Other Ambulatory Visit: Payer: Self-pay

## 2019-12-12 ENCOUNTER — Encounter (HOSPITAL_COMMUNITY): Payer: Self-pay

## 2019-12-12 DIAGNOSIS — R072 Precordial pain: Secondary | ICD-10-CM | POA: Diagnosis not present

## 2019-12-12 MED ORDER — NITROGLYCERIN 0.4 MG SL SUBL: 0.8000 mg | SUBLINGUAL_TABLET | Freq: Once | SUBLINGUAL | Status: AC

## 2019-12-12 MED ORDER — NITROGLYCERIN 0.4 MG SL SUBL
SUBLINGUAL_TABLET | SUBLINGUAL | Status: AC
  Administered 2019-12-12: 0.8 mg via SUBLINGUAL
  Filled 2019-12-12: qty 2

## 2019-12-12 MED ORDER — IOHEXOL 350 MG/ML SOLN
80.0000 mL | Freq: Once | INTRAVENOUS | Status: AC | PRN
  Administered 2019-12-12: 80 mL via INTRAVENOUS

## 2019-12-19 ENCOUNTER — Telehealth: Payer: Self-pay | Admitting: Cardiovascular Disease

## 2019-12-19 NOTE — Telephone Encounter (Signed)
Patient is returning call to discuss CT results. 

## 2019-12-19 NOTE — Telephone Encounter (Signed)
Patient aware of CTA results and verbalized understanding.

## 2019-12-24 ENCOUNTER — Telehealth: Payer: Self-pay

## 2019-12-24 NOTE — Telephone Encounter (Signed)
Told Ms Tiffany Gilmore that the vaginal spotting is normal as the sutures in the vaginal cuff begin to dissolve about 6-8 weeks after surgery. Her surgery was 11-18-19.  She is to call the office if she has any increase in bleeding like a period. Pt verbalized understanding.

## 2020-01-01 ENCOUNTER — Other Ambulatory Visit: Payer: Self-pay

## 2020-01-01 ENCOUNTER — Ambulatory Visit (HOSPITAL_BASED_OUTPATIENT_CLINIC_OR_DEPARTMENT_OTHER): Payer: Medicare Other

## 2020-01-01 DIAGNOSIS — I10 Essential (primary) hypertension: Secondary | ICD-10-CM

## 2020-01-01 DIAGNOSIS — R0789 Other chest pain: Secondary | ICD-10-CM

## 2020-01-01 DIAGNOSIS — R072 Precordial pain: Secondary | ICD-10-CM

## 2020-01-01 DIAGNOSIS — R600 Localized edema: Secondary | ICD-10-CM

## 2020-01-01 DIAGNOSIS — R5383 Other fatigue: Secondary | ICD-10-CM

## 2020-01-01 DIAGNOSIS — R0683 Snoring: Secondary | ICD-10-CM

## 2020-01-06 ENCOUNTER — Telehealth: Payer: Self-pay | Admitting: *Deleted

## 2020-01-06 DIAGNOSIS — N390 Urinary tract infection, site not specified: Secondary | ICD-10-CM | POA: Diagnosis not present

## 2020-01-06 DIAGNOSIS — R350 Frequency of micturition: Secondary | ICD-10-CM | POA: Diagnosis not present

## 2020-01-06 NOTE — Telephone Encounter (Signed)
Returned the patient's call; patient stated "I'm having pain with urination. I feel like my insides and turning out." Per Melissa APP patient is to contact her PCP

## 2020-01-09 ENCOUNTER — Other Ambulatory Visit: Payer: Self-pay

## 2020-01-09 ENCOUNTER — Ambulatory Visit (HOSPITAL_BASED_OUTPATIENT_CLINIC_OR_DEPARTMENT_OTHER): Payer: Medicare Other | Attending: Cardiovascular Disease | Admitting: Cardiovascular Disease

## 2020-01-13 ENCOUNTER — Telehealth: Payer: Self-pay | Admitting: *Deleted

## 2020-01-13 ENCOUNTER — Ambulatory Visit (HOSPITAL_BASED_OUTPATIENT_CLINIC_OR_DEPARTMENT_OTHER): Payer: Medicare Other | Attending: Cardiovascular Disease | Admitting: Cardiovascular Disease

## 2020-01-13 ENCOUNTER — Other Ambulatory Visit: Payer: Self-pay

## 2020-01-13 DIAGNOSIS — Z6841 Body Mass Index (BMI) 40.0 and over, adult: Secondary | ICD-10-CM | POA: Insufficient documentation

## 2020-01-13 DIAGNOSIS — R0683 Snoring: Secondary | ICD-10-CM

## 2020-01-13 DIAGNOSIS — Z79899 Other long term (current) drug therapy: Secondary | ICD-10-CM | POA: Insufficient documentation

## 2020-01-13 DIAGNOSIS — R5383 Other fatigue: Secondary | ICD-10-CM | POA: Insufficient documentation

## 2020-01-13 DIAGNOSIS — Z7982 Long term (current) use of aspirin: Secondary | ICD-10-CM | POA: Insufficient documentation

## 2020-01-13 DIAGNOSIS — G4736 Sleep related hypoventilation in conditions classified elsewhere: Secondary | ICD-10-CM | POA: Insufficient documentation

## 2020-01-13 DIAGNOSIS — R0789 Other chest pain: Secondary | ICD-10-CM | POA: Insufficient documentation

## 2020-01-13 DIAGNOSIS — G4733 Obstructive sleep apnea (adult) (pediatric): Secondary | ICD-10-CM | POA: Insufficient documentation

## 2020-01-13 DIAGNOSIS — I10 Essential (primary) hypertension: Secondary | ICD-10-CM | POA: Diagnosis not present

## 2020-01-13 DIAGNOSIS — Z79891 Long term (current) use of opiate analgesic: Secondary | ICD-10-CM | POA: Diagnosis not present

## 2020-01-13 NOTE — Telephone Encounter (Signed)
Patient is scheduled for lab study on 01/13/20. Patient understands her sleep study will be done at [facility] sleep lab. Patient understands she will receive a sleep packet in a week or so. Patient understands to call if she does not receive the sleep packet in a timely manner. Patient agrees with treatment and thanked me for call. Scheduled with Kia

## 2020-01-27 ENCOUNTER — Telehealth: Payer: Self-pay | Admitting: *Deleted

## 2020-01-27 ENCOUNTER — Encounter (HOSPITAL_BASED_OUTPATIENT_CLINIC_OR_DEPARTMENT_OTHER): Payer: Self-pay | Admitting: Cardiovascular Disease

## 2020-01-27 DIAGNOSIS — G4733 Obstructive sleep apnea (adult) (pediatric): Secondary | ICD-10-CM

## 2020-01-27 NOTE — Telephone Encounter (Signed)
-----   Message from Lennette Bihari, MD sent at 01/27/2020  2:43 PM EST ----- [name], please notify pt of results and schedule for CPAP titration study.

## 2020-01-27 NOTE — Telephone Encounter (Signed)
Informed patient of sleep study results and patient understanding was verbalized. Patient understands her sleep study showed   IMPRESSIONS - Moderate obstructive sleep apnea occurred during this study (AHI  23.0/h; RDI 35.1/h); however, events were very severe during REM sleep (AHI 63.4/h).). - Significant oxygen desaturation to a nadir of 78%. - The patient snored with moderate snoring volume. - EKG findings include PVCs. - Clinically significant periodic limb movements did not occur during sleep. No significant associated arousals.  DIAGNOSIS - Obstructive Sleep Apnea (G47.33) - Nocturnal Hypoxemia (G47.36)  RECOMMENDATIONS - Therapeutic CPAP titration to determine optimal pressure required to alleviate sleep disordered breathing  Titration sent to sleep pool DPR Left detailed message on voicemail and informed patient to call back with questions

## 2020-01-27 NOTE — Procedures (Signed)
    Patient Name: Tiffany Gilmore, Tiffany Gilmore Date: 01/13/2020 Gender: Female D.O.B: April 28, 1945 Age (years): 39 Referring Provider: Ronnald Ramp ONeal Height (inches): 59 Interpreting Physician: Nicki Guadalajara MD, ABSM Weight (lbs): 204 RPSGT: Shelah Lewandowsky BMI: 41 MRN: [id number] Neck Size: 17.00  CLINICAL INFORMATION Sleep Study Type: NPSG  Indication for sleep study: Excessive Daytime Sleepiness, Fatigue, Hypertension, Obesity, Snoring  Epworth Sleepiness Score: 3  SLEEP STUDY TECHNIQUE As per the AASM Manual for the Scoring of Sleep and Associated Events v2.3 (April 2016) with a hypopnea requiring 4% desaturations.  The channels recorded and monitored were frontal, central and occipital EEG, electrooculogram (EOG), submentalis EMG (chin), nasal and oral airflow, thoracic and abdominal wall motion, anterior tibialis EMG, snore microphone, electrocardiogram, and pulse oximetry.  MEDICATIONS aspirin 81 MG tablet fish oil-omega-3 fatty acids 1000 MG capsule losartan (COZAAR) 50 MG tablet senna-docusate (SENOKOT-S) 8.6-50 MG tablet traMADol (ULTRAM) 50 MG tablet Medications self-administered by patient taken the night of the study : N/A  SLEEP ARCHITECTURE The study was initiated at 10:22:34 PM and ended at 4:44:32 AM.  Sleep onset time was 5.4 minutes and the sleep efficiency was 79.2%%. The total sleep time was 302.5 minutes.  Stage REM latency was 94.5 minutes.  The patient spent 11.9%% of the night in stage N1 sleep, 67.4%% in stage N2 sleep, 0.0%% in stage N3 and 20.7% in REM.  Alpha intrusion was absent.  Supine sleep was 66.61%.  RESPIRATORY PARAMETERS The overall apnea/hypopnea index (AHI) was 23.0 per hour. The respiratory disturbance index (RDI) was 35.1/h. There were 47 total apneas, including 46 obstructive, 1 central and 0 mixed apneas. There were 69 hypopneas and 61 RERAs.  The AHI during Stage REM sleep was 63.4 per hour.  AHI while supine was 27.1 per  hour.  The mean oxygen saturation was 94.8%. The minimum SpO2 during sleep was 78.0%.  Moderate snoring was noted during this study.  CARDIAC DATA The 2 lead EKG demonstrated sinus rhythm. The mean heart rate was 79.6 beats per minute. Other EKG findings include: PVCs.  LEG MOVEMENT DATA The total PLMS were 0 with a resulting PLMS index of 0.0. Associated arousal with leg movement index was 0.0 .  IMPRESSIONS - Moderate obstructive sleep apnea occurred during this study (AHI  23.0/h; RDI 35.1/h); however, events were very severe during REM sleep (AHI 63.4/h).). - No significant central sleep apnea occurred during this study (CAI = 0.2/h). - Significant oxygen desaturation to a nadir of 78%. - The patient snored with moderate snoring volume. - EKG findings include PVCs. - Clinically significant periodic limb movements did not occur during sleep. No significant associated arousals.  DIAGNOSIS - Obstructive Sleep Apnea (G47.33) - Nocturnal Hypoxemia (G47.36)  RECOMMENDATIONS - Therapeutic CPAP titration to determine optimal pressure required to alleviate sleep disordered breathing. - Effort should be made to optimize nasal and oropharyngeal patency. - Positional therapy avoiding supine position during sleep. - Avoid alcohol, sedatives and other CNS depressants that may worsen sleep apnea and disrupt normal sleep architecture. - Sleep hygiene should be reviewed to assess factors that may improve sleep quality. - Weight management (BMI 41) and regular exercise should be initiated or continued if appropriate.  [Electronically signed] 01/27/2020 02:37 PM  Nicki Guadalajara MD, [facility], ABSM Diplomate, American Board of Sleep Medicine   NPI: [id number] [facility] PH: [phone]   FX: [phone] ACCREDITED BY THE AMERICAN ACADEMY OF SLEEP MEDICINE

## 2020-01-28 ENCOUNTER — Encounter: Payer: Self-pay | Admitting: *Deleted

## 2020-01-28 NOTE — Telephone Encounter (Signed)
Patient is scheduled for CPAP Titration on 03/10/20. Patient understands her titration study will be done at [facility] sleep lab. Patient understands she will receive a letter in a week or so detailing appointment, date, time, and location. Patient understands to call if she does not receive the letter  in a timely manner. Patient agrees with treatment and thanked me for call.

## 2020-02-03 DIAGNOSIS — E782 Mixed hyperlipidemia: Secondary | ICD-10-CM | POA: Diagnosis not present

## 2020-02-03 DIAGNOSIS — I10 Essential (primary) hypertension: Secondary | ICD-10-CM | POA: Diagnosis not present

## 2020-02-10 DIAGNOSIS — I10 Essential (primary) hypertension: Secondary | ICD-10-CM | POA: Diagnosis not present

## 2020-02-10 DIAGNOSIS — E782 Mixed hyperlipidemia: Secondary | ICD-10-CM | POA: Diagnosis not present

## 2020-02-10 DIAGNOSIS — R600 Localized edema: Secondary | ICD-10-CM | POA: Diagnosis not present

## 2020-02-10 DIAGNOSIS — L409 Psoriasis, unspecified: Secondary | ICD-10-CM | POA: Diagnosis not present

## 2020-02-18 DIAGNOSIS — N3000 Acute cystitis without hematuria: Secondary | ICD-10-CM | POA: Diagnosis not present

## 2020-03-02 ENCOUNTER — Ambulatory Visit: Payer: PRIVATE HEALTH INSURANCE | Admitting: Cardiovascular Disease

## 2020-03-10 ENCOUNTER — Encounter (HOSPITAL_BASED_OUTPATIENT_CLINIC_OR_DEPARTMENT_OTHER): Payer: Medicare Other | Admitting: Cardiovascular Disease

## 2020-03-11 ENCOUNTER — Ambulatory Visit (HOSPITAL_BASED_OUTPATIENT_CLINIC_OR_DEPARTMENT_OTHER): Payer: Medicare Other | Attending: Cardiovascular Disease | Admitting: Cardiovascular Disease

## 2020-03-11 ENCOUNTER — Other Ambulatory Visit: Payer: Self-pay

## 2020-03-11 DIAGNOSIS — R5383 Other fatigue: Secondary | ICD-10-CM | POA: Diagnosis not present

## 2020-03-11 DIAGNOSIS — G4733 Obstructive sleep apnea (adult) (pediatric): Secondary | ICD-10-CM | POA: Diagnosis not present

## 2020-03-12 ENCOUNTER — Other Ambulatory Visit (HOSPITAL_BASED_OUTPATIENT_CLINIC_OR_DEPARTMENT_OTHER): Payer: Self-pay

## 2020-03-12 DIAGNOSIS — G4733 Obstructive sleep apnea (adult) (pediatric): Secondary | ICD-10-CM

## 2020-03-21 NOTE — Progress Notes (Signed)
Cardiology Office Note:   Date:  03/23/2020  NAME:  Tiffany Gilmore    MRN: [id number] DOB:  04-Sep-1945   PCP:  Merrilee Seashore, MD  Cardiologist:  No primary care provider on file.  Electrophysiologist:  None   Referring MD: Merrilee Seashore, MD   Chief Complaint  Patient presents with  . Follow-up         History of Present Illness:   Tiffany Gilmore is a 75 y.o. female with a hx of HTN, obesity, HLD who presents for follow-up. Seen in November for CP. Normal CCTA. Found to have OSA. She has been diagnosed with sleep apnea.  Still has not received her machine.  We will reach out to sleep medicine to determine where she is in the process.  BP 150/72.  We discussed going on hydrochlorothiazide.  She is okay to do this.  We also discussed salt reduction strategies.  She needs a work on this as well.  She still working as an Optometrist.  Reports her diet is poor.  She is also overweight.  BMI is 42.  No further chest pain episodes.  She is not that active.  Her lower extremity edema improved after coming off amlodipine.  She still does need to be on another blood pressure medication.  She understands the importance of this.  We discussed that given her normal coronary CTA she can stop aspirin.  She is relieved to stop this medication.  Overall doing well.  Just need to get her sleep apnea treated and blood pressure better controlled.  Problem List 1. Endometrial CA -s/p total hysterectomy and bilateral salpingoophorectomy 11/18/2019 2. HTN 3. Obesity 4. SVT ablation 5.  Hyperlipidemia -HDL 76, LDL 184, triglycerides 56 6. Moderate OSA  Past Medical History: Past Medical History:  Diagnosis Date  . Arthritis   . Cancer (Tiffany Gilmore)    breast R  . Cataract   . Endometrial cancer (Tiffany Gilmore)   . GERD (gastroesophageal reflux disease)   . Hyperlipidemia   . Hypertension   . SVT (supraventricular tachycardia) ([place])    Ablation in 2002 to treat    Past Surgical History: Past Surgical  History:  Procedure Laterality Date  . ABLATION     Cardiac ablation  . BREAST SURGERY    . CATARACT EXTRACTION W/ INTRAOCULAR LENS IMPLANT    . CESAREAN SECTION    . EYE SURGERY    . ROBOTIC ASSISTED LAPAROSCOPIC LYSIS OF ADHESION N/A 11/18/2019   Procedure: XI ROBOTIC ASSISTED LAPAROSCOPIC LYSIS OF ADHESION;  Surgeon: Everitt Amber, MD;  Location: [facility];  Service: Gynecology;  Laterality: N/A;  . ROBOTIC ASSISTED TOTAL HYSTERECTOMY WITH BILATERAL SALPINGO OOPHERECTOMY Bilateral 11/18/2019   Procedure: XI ROBOTIC ASSISTED TOTAL HYSTERECTOMY WITH BILATERAL SALPINGO OOPHORECTOMY;  Surgeon: Everitt Amber, MD;  [facility];  Service: Gynecology;  Laterality: Bilateral;  . SENTINEL NODE BIOPSY N/A 11/18/2019   Procedure: SENTINEL NODE BIOPSY;  Surgeon: Everitt Amber, MD;  [facility];  Service: Gynecology;  Laterality: N/A;    Current Medications: Current Meds  Medication Sig  . fish oil-omega-3 fatty acids 1000 MG capsule Take 2 g by mouth daily.   . hydrochlorothiazide (HYDRODIURIL) 25 MG tablet Take 1 tablet (25 mg total) by mouth daily.  Marland Kitchen losartan (COZAAR) 50 MG tablet Take 1 tablet (50 mg total) by mouth daily.  . [DISCONTINUED] aspirin 81 MG tablet Take 81 mg by mouth daily.      Allergies:    Aspirin, Penicillin g, and Sulfa  antibiotics   Social History: Social History   Socioeconomic History  . Marital status: Married    Spouse name: Not on file  . Number of children: 2  . Years of education: Not on file  . Highest education level: Not on file  Occupational History  . Occupation: working as Catering manager  . Smoking status: Never Smoker  . Smokeless tobacco: Never Used  Vaping Use  . Vaping Use: Never used  Substance and Sexual Activity  . Alcohol use: No  . Drug use: No  . Sexual activity: Yes  Other Topics Concern  . Not on file  Social History Narrative  . Not on file   Social Determinants of Health   Financial Resource Strain: Not on file   Food Insecurity: Not on file  Transportation Needs: Not on file  Physical Activity: Not on file  Stress: Not on file  Social Connections: Not on file     Family History: The patient's family history includes Heart disease in her brother.  ROS:   All other ROS reviewed and negative. Pertinent positives noted in the HPI.     EKGs/Labs/Other Studies Reviewed:   The following studies were personally reviewed by me today:  CCTA 12/12/2019 IMPRESSION: 1. Coronary calcium score of 0.  2. Normal coronary origin with left dominance.  3. Normal coronary arteries.  4. Small PFO.  5. Mildly dilated pulmonary artery.  Recent Labs: 11/10/2019: ALT 16 11/23/2019: BUN 12; Creatinine, Ser 0.87; Hemoglobin 14.1; Platelets 279; Potassium 3.5; Sodium 147   Recent Lipid Panel No results found for: CHOL, TRIG, HDL, CHOLHDL, VLDL, LDLCALC, LDLDIRECT  Physical Exam:   VS:  BP (!) 150/72   Pulse 92   Ht 4\' 11"  (1.499 m)   Wt 205 lb 6.4 oz (93.2 kg)   SpO2 96%   BMI 41.49 kg/m    Wt Readings from Last 3 Encounters:  03/23/20 205 lb 6.4 oz (93.2 kg)  03/11/20 204 lb (92.5 kg)  01/13/20 204 lb (92.5 kg)    General: Well nourished, well developed, in no acute distress Head: Atraumatic, normal size  Eyes: PEERLA, EOMI  Neck: Supple, no JVD Endocrine: No thryomegaly Cardiac: Normal S1, S2; RRR; no murmurs, rubs, or gallops Lungs: Clear to auscultation bilaterally, no wheezing, rhonchi or rales  Abd: Soft, nontender, no hepatomegaly  Ext: No edema, pulses 2+ Musculoskeletal: No deformities, BUE and BLE strength normal and equal Skin: Warm and dry, no rashes   Neuro: Alert and oriented to person, place, time, and situation, CNII-XII grossly intact, no focal deficits  Psych: Normal mood and affect   ASSESSMENT:   Tiffany Gilmore is a 75 y.o. female who presents for the following: 1. OSA (obstructive sleep apnea)   2. Snoring   3. Primary hypertension   4. Other chest pain      PLAN:   1. OSA (obstructive sleep apnea) 2. Snoring -Moderate sleep apnea on recent test.  Likely contributing to her blood pressure.  She will work to get this done.  -We will send a message to sleep medicine to determine where she is in the process.  3. Primary hypertension -BP 150/72.  Not controlled.  Add HCTZ 25 mg daily.  Continue losartan 50 mg daily.  She was given salty 6 information.  She was also given a blood pressure log.  I will see her back in roughly 4 months for further evaluation of her blood pressure.  Hopefully she will be treated for  sleep apnea as this will improve her blood pressure. -We discussed no strong indication for aspirin.  She is okay to hold this.  4. Other chest pain -Noncardiac chest pain.  Normal coronary CTA.  She can stop aspirin.  Disposition: Return in about 4 months (around 07/21/2020).  Medication Adjustments/Labs and Tests Ordered: Current medicines are reviewed at length with the patient today.  Concerns regarding medicines are outlined above.  No orders of the defined types were placed in this encounter.  Meds ordered this encounter  Medications  . hydrochlorothiazide (HYDRODIURIL) 25 MG tablet    Sig: Take 1 tablet (25 mg total) by mouth daily.    Dispense:  90 tablet    Refill:  3    Patient Instructions  Medication Instructions:  Stop Aspirin Start HCTZ 25 mg daily   *If you need a refill on your cardiac medications before your next appointment, please call your pharmacy*   Follow-Up: At [facility], you and your health needs are our priority.  As part of our continuing mission to provide you with exceptional heart care, we have created designated Provider Care Teams.  These Care Teams include your primary Cardiologist (physician) and Advanced Practice Providers (APPs -  Physician Assistants and Nurse Practitioners) who all work together to provide you with the care you need, when you need it.  We recommend signing up for  the patient portal called "MyChart".  Sign up information is provided on this After Visit Summary.  MyChart is used to connect with patients for Virtual Visits (Telemedicine).  Patients are able to view lab/test results, encounter notes, upcoming appointments, etc.  Non-urgent messages can be sent to your provider as well.   To learn more about what you can do with MyChart, go to [email or web address].    Your next appointment:   4 month(s)  The format for your next appointment:   In Person  Provider:   Eleonore Chiquito, MD   Other Instructions Please use blood pressure logs to use- check BP once daily, bring logs back with you to follow up appointment      Time Spent with Patient: I have spent a total of 25 minutes with patient reviewing hospital notes, telemetry, EKGs, labs and examining the patient as well as establishing an assessment and plan that was discussed with the patient.  > 50% of time was spent in direct patient care.  Signed, Addison Naegeli. Audie Box, [place]  [address], [place],  [address] [phone]  03/23/2020 5:07 PM

## 2020-03-23 ENCOUNTER — Encounter: Payer: Self-pay | Admitting: Cardiovascular Disease

## 2020-03-23 ENCOUNTER — Other Ambulatory Visit: Payer: Self-pay

## 2020-03-23 ENCOUNTER — Ambulatory Visit (INDEPENDENT_AMBULATORY_CARE_PROVIDER_SITE_OTHER): Payer: Medicare Other | Admitting: Cardiovascular Disease

## 2020-03-23 VITALS — BP 150/72 | HR 92 | Ht 59.0 in | Wt 205.4 lb

## 2020-03-23 DIAGNOSIS — G4733 Obstructive sleep apnea (adult) (pediatric): Secondary | ICD-10-CM

## 2020-03-23 DIAGNOSIS — I10 Essential (primary) hypertension: Secondary | ICD-10-CM | POA: Diagnosis not present

## 2020-03-23 DIAGNOSIS — R0789 Other chest pain: Secondary | ICD-10-CM | POA: Diagnosis not present

## 2020-03-23 DIAGNOSIS — R0683 Snoring: Secondary | ICD-10-CM

## 2020-03-23 MED ORDER — HYDROCHLOROTHIAZIDE 25 MG PO TABS: 25.0000 mg | ORAL_TABLET | Freq: Every day | ORAL | 3 refills | Status: DC

## 2020-03-23 NOTE — Patient Instructions (Signed)
Medication Instructions:  Stop Aspirin Start HCTZ 25 mg daily   *If you need a refill on your cardiac medications before your next appointment, please call your pharmacy*   Follow-Up: At [facility], you and your health needs are our priority.  As part of our continuing mission to provide you with exceptional heart care, we have created designated Provider Care Teams.  These Care Teams include your primary Cardiologist (physician) and Advanced Practice Providers (APPs -  Physician Assistants and Nurse Practitioners) who all work together to provide you with the care you need, when you need it.  We recommend signing up for the patient portal called "MyChart".  Sign up information is provided on this After Visit Summary.  MyChart is used to connect with patients for Virtual Visits (Telemedicine).  Patients are able to view lab/test results, encounter notes, upcoming appointments, etc.  Non-urgent messages can be sent to your provider as well.   To learn more about what you can do with MyChart, go to [email or web address].    Your next appointment:   4 month(s)  The format for your next appointment:   In Person  Provider:   Eleonore Chiquito, MD   Other Instructions Please use blood pressure logs to use- check BP once daily, bring logs back with you to follow up appointment

## 2020-03-24 ENCOUNTER — Telehealth: Payer: Self-pay | Admitting: *Deleted

## 2020-03-24 NOTE — Telephone Encounter (Signed)
Called patient and informed her that Dr Claiborne Billings has not read her sleep study yet. Once this has been done a machine will be ordered for her. I still cannot promise her how long it will take to get a machine due to them being on back order. Patient voiced her understanding of what was told to her.

## 2020-03-26 ENCOUNTER — Encounter (HOSPITAL_BASED_OUTPATIENT_CLINIC_OR_DEPARTMENT_OTHER): Payer: Self-pay | Admitting: Cardiovascular Disease

## 2020-03-26 NOTE — Procedures (Signed)
Patient Name: Tiffany Gilmore, Tiffany Gilmore Date: 03/11/2020 Gender: Female D.O.B: 1945/09/19 Age (years): 75 Referring Provider: Cassie Freer ONeal Height (inches): 59 Interpreting Physician: Shelva Majestic MD, ABSM Weight (lbs): 204 RPSGT: Zadie Rhine BMI: 41 MRN: [id number] Neck Size: 17.00  CLINICAL INFORMATION The patient is referred for a CPAP titration to treat sleep apnea.  Date of NPSG:  01/13/2020:  AHI 23/h; RDI 35/h; supine AHI 27/h; REM AHI 63.4/h; O2 nadir 78%.  SLEEP STUDY TECHNIQUE As per the AASM Manual for the Scoring of Sleep and Associated Events v2.3 (April 2016) with a hypopnea requiring 4% desaturations.  The channels recorded and monitored were frontal, central and occipital EEG, electrooculogram (EOG), submentalis EMG (chin), nasal and oral airflow, thoracic and abdominal wall motion, anterior tibialis EMG, snore microphone, electrocardiogram, and pulse oximetry. Continuous positive airway pressure (CPAP) was initiated at the beginning of the study and titrated to treat sleep-disordered breathing.  MEDICATIONS fish oil-omega-3 fatty acids 1000 MG capsule hydrochlorothiazide (HYDRODIURIL) 25 MG tablet losartan (COZAAR) 50 MG tablet(Expired) Medications self-administered by patient taken the night of the study : N/A  TECHNICIAN COMMENTS Comments added by technician: PT HAD ONE RESTROOM VISTED. Patient had difficulty initiating sleep. Comments added by scorer: N/A  RESPIRATORY PARAMETERS Optimal PAP Pressure (cm): 18 AHI at Optimal Pressure (/hr): 0.0 Overall Minimal O2 (%): 84.0 Supine % at Optimal Pressure (%): 100 Minimal O2 at Optimal Pressure (%): 89.0   SLEEP ARCHITECTURE The study was initiated at 9:35:49 PM and ended at 4:49:35 AM.  Sleep onset time was 20.4 minutes and the sleep efficiency was 71.1%%. The total sleep time was 308.5 minutes.  The patient spent 5.8%% of the night in stage N1 sleep, 79.3%% in stage N2 sleep, 0.0%% in stage N3 and  14.9% in REM.Stage REM latency was 365.0 minutes  Wake after sleep onset was 104.9. Alpha intrusion was absent. Supine sleep was 71.64%.  CARDIAC DATA The 2 lead EKG demonstrated sinus rhythm. The mean heart rate was 78.6 beats per minute. Other EKG findings include: rare PVC  LEG MOVEMENT DATA The total Periodic Limb Movements of Sleep (PLMS) were 0. The PLMS index was 0.0. A PLMS index of <15 is considered normal in adults.  IMPRESSIONS - CPAP was initiated at 6 cm and titrated to optimal PAP at 18 cm of water; AHI 0 with 41 minutes of REM sleep, O2 nadir 89%. - Central sleep apnea was not noted during this titration (CAI = 0.0/h). - Moderate oxygen desaturations to a nadir of 84.0% at 17 cm. - Elimination of snoring at 18 cm of water.  - Rare PVCs were observed during this study. - Clinically significant periodic limb movements were not noted during this study. Arousals associated with PLMs were rare.  DIAGNOSIS - Obstructive Sleep Apnea (G47.33)  RECOMMENDATIONS - Recommend an initial trial of CPAP Auto therapy with EPR of 3 at 15 - 20 cm H2O with heated humidification.  A Medium size Resmed Full Face Mask AirFit F20 mask was used for the titration. - Effort should be made to optimize nasal and oropharyngeal patency. - Avoid alcohol, sedatives and other CNS depressants that may worsen sleep apnea and disrupt normal sleep architecture. - Sleep hygiene should be reviewed to assess factors that may improve sleep quality. - Weight management and regular exercise should be initiated or continued. - Recommend a download in 30 days and sleep clinic evaluation after 4 weeks of therapy  [Electronically signed] 03/26/2020 11:11 AM  Shelva Majestic MD, [facility], ABSM  Diplomate, Tax adviser of Sleep Medicine   NPI: [id number] [place] PH: [phone]   FX: [phone] [place]

## 2020-03-31 ENCOUNTER — Telehealth: Payer: Self-pay | Admitting: *Deleted

## 2020-03-31 MED ORDER — SPIRONOLACTONE 25 MG PO TABS: 25.0000 mg | ORAL_TABLET | Freq: Every day | ORAL | 3 refills | Status: DC

## 2020-03-31 NOTE — Telephone Encounter (Signed)
Patient notified sleep study has been completed and orders for CPAP faxed to [facility].

## 2020-03-31 NOTE — Telephone Encounter (Signed)
Patient called to say that she is allergic to sulfur and therefore she has not taken the HCTZ prescribed at her last OV. Dr Audie Box was notified and ordered her to stop the HCTZ and start new RX for aldactone 25 mg. Prescription was sent in to [facility] via escribe.

## 2020-05-31 ENCOUNTER — Encounter: Payer: Self-pay | Admitting: Gynecologic Oncology

## 2020-06-01 NOTE — Progress Notes (Signed)
Follow-up Note: Gyn-Onc  Consult was requested by Dr. Wilhelmenia Blase for the evaluation of Tiffany Gilmore 75 y.o. female  CC:  Chief Complaint  Patient presents with  . Endometrial adenocarcinoma [facility])    Assessment/Plan:  Tiffany Gilmore  is a 75 y.o.  year old P3 with stage IA grade 1 endometrial cancer (MMRd, MLH1 hypermethylation present). S/p staging surgery in September, 2021.  Pathology revealed low risk factors for recurrence, therefore no adjuvant therapy is recommended according to NCCN guidelines.  I discussed risk for recurrence and typical symptoms encouraged her to notify us of these should they develop between visits.  I recommend she continue to have follow-up every 6 months for 5 years in accordance with NCCN guidelines. Those visits should include symptom assessment, physical exam and pelvic examination. Pap smears are not indicated or recommended in the routine surveillance of endometrial cancer.  HPI: Tiffany Gilmore is a 75 year old P3 who was seen in consultation at the request of Dr Wilhelmenia Blase for evaluation and treatment of grade 1 endometrial cancer.   Her symptoms began in approximately 2020 with postmenopausal spotting.  On October 11, 2019 she developed heavier vaginal bleeding which was alarming and a change noted by the patient and therefore she presented to the emergency department on that same day.  The emergency department performed a transvaginal ultrasound scan on October 12, 2019 which revealed a uterus measuring 7.6 x 3.8 x 5 cm with an endometrial thickness of 17 mm.  The right and left ovaries were not visualized..  She did not have a gynecologist with whom to follow-up and therefore was scheduled to see Dr Wilhelmenia Blase for a pelvic examination on October 22, 2019.  Work-up of symptoms included an endometrial biopsy. Endometrial sampling with P[place] in the office was performed on 10/22/2019 and showed endometrioid adenocarcinoma with squamous differentiation, FIGO  grade 1.   On 11/18/2019 she underwent robotic assisted total hysterectomy with BSO and sentinel lymph node biopsy and pelvic adhesiolysis. Intraoperative findings were significant for a 6 cm uterus, normal tubes and ovaries, dense omental adhesions to the anterior abdominal wall into the uterus.  No gross extrauterine disease. Surgery was uncomplicated.  Final pathology revealed a FIGO grade 1 endometrioid adenocarcinoma with 5 of 14 mm myometrial invasion no LVSI.  MMR deficiencies in MLH1 and PMS2.  MLH1 hyper methylation was present therefore Lynch testing not ordered.  Interval Hx:  She has improved sensation in the distribution of the genitofemoral nerve and no symptoms concerning for recurrence.     Current Meds:  Outpatient Encounter Medications as of 06/03/2020  Medication Sig  . fish oil-omega-3 fatty acids 1000 MG capsule Take 2 g by mouth daily.   Marland Kitchen losartan (COZAAR) 50 MG tablet Take 50 mg by mouth daily.  Marland Kitchen spironolactone (ALDACTONE) 25 MG tablet Take 1 tablet (25 mg total) by mouth daily. Stop the HCTZ (Patient not taking: Reported on 05/31/2020)  . [DISCONTINUED] losartan (COZAAR) 50 MG tablet Take 1 tablet (50 mg total) by mouth daily.   No facility-administered encounter medications on file as of 06/03/2020.    Allergy:  Allergies  Allergen Reactions  . Aspirin     REACTION: upsets stomach at times  . Penicillin G Other (See Comments)  . Sulfa Antibiotics Rash    Social Hx:   Social History   Socioeconomic History  . Marital status: Married    Spouse name: Not on file  . Number of children: 2  . Years of education:  Not on file  . Highest education level: Not on file  Occupational History  . Occupation: working as Catering manager  . Smoking status: Never Smoker  . Smokeless tobacco: Never Used  Vaping Use  . Vaping Use: Never used  Substance and Sexual Activity  . Alcohol use: No  . Drug use: No  . Sexual activity: Yes  Other Topics Concern  . Not  on file  Social History Narrative  . Not on file   Social Determinants of Health   Financial Resource Strain: Not on file  Food Insecurity: Not on file  Transportation Needs: Not on file  Physical Activity: Not on file  Stress: Not on file  Social Connections: Not on file  Intimate Partner Violence: Not on file    Past Surgical Hx:  Past Surgical History:  Procedure Laterality Date  . ABLATION     Cardiac ablation  . BREAST SURGERY    . CATARACT EXTRACTION W/ INTRAOCULAR LENS IMPLANT    . CESAREAN SECTION    . EYE SURGERY    . ROBOTIC ASSISTED LAPAROSCOPIC LYSIS OF ADHESION N/A 11/18/2019   Procedure: XI ROBOTIC ASSISTED LAPAROSCOPIC LYSIS OF ADHESION;  Surgeon: Everitt Amber, MD;  Location: [facility];  Service: Gynecology;  Laterality: N/A;  . ROBOTIC ASSISTED TOTAL HYSTERECTOMY WITH BILATERAL SALPINGO OOPHERECTOMY Bilateral 11/18/2019   Procedure: XI ROBOTIC ASSISTED TOTAL HYSTERECTOMY WITH BILATERAL SALPINGO OOPHORECTOMY;  Surgeon: Everitt Amber, MD;  [facility];  Service: Gynecology;  Laterality: Bilateral;  . SENTINEL NODE BIOPSY N/A 11/18/2019   Procedure: SENTINEL NODE BIOPSY;  Surgeon: Everitt Amber, MD;  [facility]: [facility];  Service: Gynecology;  Laterality: N/A;    Past Medical Hx:  Past Medical History:  Diagnosis Date  . Arthritis   . Cancer ([place])    breast R  . Cataract   . Endometrial cancer ([place])   . GERD (gastroesophageal reflux disease)   . Hyperlipidemia   . Hypertension   . SVT (supraventricular tachycardia) ([place])    Ablation in 2002 to treat    Past Gynecological History:  See HPI No LMP recorded. Patient is postmenopausal.  Family Hx:  Family History  Problem Relation Age of Onset  . Heart disease Brother     Review of Systems:  Constitutional  Feels well,   ENT Normal appearing ears and nares bilaterally Skin/Breast  No rash, sores, jaundice, itching, dryness Cardiovascular  No chest pain, shortness of breath, or edema  Pulmonary  No  cough or wheeze.  Gastro Intestinal  No nausea, vomitting, or diarrhoea. No bright red blood per rectum, no abdominal pain, change in bowel movement, or constipation.  Genito Urinary  No frequency, urgency, dysuria, no bleeding Musculo Skeletal  No myalgia, arthralgia, joint swelling or pain  Neurologic  No weakness, numbness, change in gait,  Psychology  No depression, anxiety, insomnia.   Vitals:  Blood pressure (!) 170/92, pulse 86, temperature (!) 97.5 F (36.4 C), temperature source Tympanic, resp. rate 17, height 4' 11"  (1.499 m), weight 204 lb 3.2 oz (92.6 kg), SpO2 100 %.  Physical Exam: WD in NAD Neck  Supple NROM, without any enlargements.  Lymph Node Survey No cervical supraclavicular or inguinal adenopathy Cardiovascular  Pulse normal rate, regularity and rhythm. S1 and S2 normal.  Lungs  Clear to auscultation bilateraly, without wheezes/crackles/rhonchi. Good air movement.  Skin  No rash/lesions/breakdown  Psychiatry  Alert and oriented to person, place, and time  Abdomen  Normoactive bowel sounds, abdomen soft, non-tender and  obese without evidence of hernia. Vertical midline incision. Short waisted, protuberant abdomen. Soft incisions. Back No CVA tenderness Genito Urinary:  Vagina normal vaginal cuff, smooth, no lesions or palpable masses Rectal  deferred Extremities  No bilateral cyanosis, clubbing or edema.   Thereasa Solo, MD  06/03/2020, 1:28 PM

## 2020-06-03 ENCOUNTER — Other Ambulatory Visit: Payer: Self-pay

## 2020-06-03 ENCOUNTER — Inpatient Hospital Stay: Payer: Medicare Other | Attending: Gynecologic Oncology | Admitting: Gynecologic Oncology

## 2020-06-03 ENCOUNTER — Encounter: Payer: Self-pay | Admitting: Gynecologic Oncology

## 2020-06-03 VITALS — BP 170/92 | HR 86 | Temp 97.5°F | Resp 17 | Ht 59.0 in | Wt 204.2 lb

## 2020-06-03 DIAGNOSIS — I10 Essential (primary) hypertension: Secondary | ICD-10-CM | POA: Insufficient documentation

## 2020-06-03 DIAGNOSIS — C541 Malignant neoplasm of endometrium: Secondary | ICD-10-CM | POA: Diagnosis not present

## 2020-06-03 DIAGNOSIS — Z90721 Acquired absence of ovaries, unilateral: Secondary | ICD-10-CM | POA: Insufficient documentation

## 2020-06-03 DIAGNOSIS — K219 Gastro-esophageal reflux disease without esophagitis: Secondary | ICD-10-CM | POA: Diagnosis not present

## 2020-06-03 DIAGNOSIS — Z90722 Acquired absence of ovaries, bilateral: Secondary | ICD-10-CM | POA: Insufficient documentation

## 2020-06-03 DIAGNOSIS — E785 Hyperlipidemia, unspecified: Secondary | ICD-10-CM | POA: Diagnosis not present

## 2020-06-03 DIAGNOSIS — I471 Supraventricular tachycardia: Secondary | ICD-10-CM | POA: Insufficient documentation

## 2020-06-03 DIAGNOSIS — Z79899 Other long term (current) drug therapy: Secondary | ICD-10-CM | POA: Diagnosis not present

## 2020-06-03 NOTE — Patient Instructions (Signed)
Please notify Dr Denman George at phone number [phone] if you notice vaginal bleeding, new pelvic or abdominal pains, bloating, feeling full easy, or a change in bladder or bowel function.   Please contact Dr Serita Grit office (at [phone]) in or after July to request an appointment with her for November, 2022.

## 2020-06-09 DIAGNOSIS — H532 Diplopia: Secondary | ICD-10-CM | POA: Diagnosis not present

## 2020-06-09 DIAGNOSIS — H52202 Unspecified astigmatism, left eye: Secondary | ICD-10-CM | POA: Diagnosis not present

## 2020-06-09 DIAGNOSIS — Z961 Presence of intraocular lens: Secondary | ICD-10-CM | POA: Diagnosis not present

## 2020-06-10 DIAGNOSIS — R5383 Other fatigue: Secondary | ICD-10-CM | POA: Diagnosis not present

## 2020-06-10 DIAGNOSIS — I10 Essential (primary) hypertension: Secondary | ICD-10-CM | POA: Diagnosis not present

## 2020-06-15 DIAGNOSIS — E782 Mixed hyperlipidemia: Secondary | ICD-10-CM | POA: Diagnosis not present

## 2020-06-15 DIAGNOSIS — Z131 Encounter for screening for diabetes mellitus: Secondary | ICD-10-CM | POA: Diagnosis not present

## 2020-06-15 DIAGNOSIS — R5383 Other fatigue: Secondary | ICD-10-CM | POA: Diagnosis not present

## 2020-06-22 DIAGNOSIS — L409 Psoriasis, unspecified: Secondary | ICD-10-CM | POA: Diagnosis not present

## 2020-06-22 DIAGNOSIS — E782 Mixed hyperlipidemia: Secondary | ICD-10-CM | POA: Diagnosis not present

## 2020-06-22 DIAGNOSIS — Z0000 Encounter for general adult medical examination without abnormal findings: Secondary | ICD-10-CM | POA: Diagnosis not present

## 2020-06-22 DIAGNOSIS — I10 Essential (primary) hypertension: Secondary | ICD-10-CM | POA: Diagnosis not present

## 2020-07-27 ENCOUNTER — Ambulatory Visit: Payer: Medicare Other | Admitting: Cardiovascular Disease

## 2020-08-08 NOTE — Progress Notes (Signed)
Cardiology Office Note:   Date:  08/10/2020  NAME:  Tiffany Gilmore    MRN: [id number] DOB:  10-08-1945   PCP:  Merrilee Seashore, MD  Cardiologist:  None  Electrophysiologist:  None   Referring MD: Merrilee Seashore, MD   Chief Complaint  Patient presents with   Follow-up   History of Present Illness:   Tiffany Gilmore is a 75 y.o. female with a hx of HTN, HLD, moderate OSA who presents for follow-up. HCTZ added at last visit as BP was not controlled.  Her blood pressure is well controlled 120/76.  She presents with a log.  Values are less than 130 on most occasions.  She made it through tach season.  She is still working as an Optometrist.  She is not taking Aldactone.  She is only taking losartan.  She did not do well with Aldactone.  It appears her blood pressure is now well controlled.  She is awaiting her CPAP machine.  Hopefully she will get this in the next few weeks.  She is not exercising.  She is still working as an Optometrist.  She reports that she needs to get better about this.  No plans to retire.  Denies any chest pain or trouble breathing.  Still has some trace edema.  This is likely venous insufficiency.  Problem List 1. Endometrial CA -s/p total hysterectomy and bilateral salpingoophorectomy 11/18/2019 2. HTN 3. Obesity 4. SVT ablation 5.  Hyperlipidemia -HDL 76, LDL 184, triglycerides 56 -CAC score 0; normal coronary arteries (CCTA: 12/2019) 6. Moderate OSA  Past Medical History: Past Medical History:  Diagnosis Date   Arthritis    Cancer ([place])    breast R   Cataract    Endometrial cancer ([place])    GERD (gastroesophageal reflux disease)    Hyperlipidemia    Hypertension    SVT (supraventricular tachycardia) ([place])    Ablation in 2002 to treat    Past Surgical History: Past Surgical History:  Procedure Laterality Date   ABLATION     Cardiac ablation   BREAST SURGERY     CATARACT EXTRACTION W/ INTRAOCULAR LENS IMPLANT     CESAREAN SECTION     EYE  SURGERY     ROBOTIC ASSISTED LAPAROSCOPIC LYSIS OF ADHESION N/A 11/18/2019   Procedure: XI ROBOTIC ASSISTED LAPAROSCOPIC LYSIS OF ADHESION;  Surgeon: Everitt Amber, MD;  Location: [facility];  Service: Gynecology;  Laterality: N/A;   ROBOTIC ASSISTED TOTAL HYSTERECTOMY WITH BILATERAL SALPINGO OOPHERECTOMY Bilateral 11/18/2019   Procedure: XI ROBOTIC ASSISTED TOTAL HYSTERECTOMY WITH BILATERAL SALPINGO OOPHORECTOMY;  Surgeon: Everitt Amber, MD;  Location: [facility];  Service: Gynecology;  Laterality: Bilateral;   SENTINEL NODE BIOPSY N/A 11/18/2019   Procedure: SENTINEL NODE BIOPSY;  Surgeon: Everitt Amber, MD;  Location: [facility];  Service: Gynecology;  Laterality: N/A;    Current Medications: Current Meds  Medication Sig   fish oil-omega-3 fatty acids 1000 MG capsule Take 2 g by mouth daily.    losartan (COZAAR) 50 MG tablet Take 50 mg by mouth daily.     Allergies:    Aspirin, Penicillin g, and Sulfa antibiotics   Social History: Social History   Socioeconomic History   Marital status: Married    Spouse name: Not on file   Number of children: 2   Years of education: Not on file   Highest education level: Not on file  Occupational History   Occupation: working as Optometrist  Tobacco Use   Smoking status: Never  Smokeless tobacco: Never  Vaping Use   Vaping Use: Never used  Substance and Sexual Activity   Alcohol use: No   Drug use: No   Sexual activity: Yes  Other Topics Concern   Not on file  Social History Narrative   Not on file   Social Determinants of Health   Financial Resource Strain: Not on file  Food Insecurity: Not on file  Transportation Needs: Not on file  Physical Activity: Not on file  Stress: Not on file  Social Connections: Not on file   Family History: The patient's family history includes Heart disease in her brother.  ROS:   All other ROS reviewed and negative. Pertinent positives noted in the HPI.     EKGs/Labs/Other Studies Reviewed:   The following  studies were personally reviewed by me today:  Recent Labs: 11/10/2019: ALT 16 11/23/2019: BUN 12; Creatinine, Ser 0.87; Hemoglobin 14.1; Platelets 279; Potassium 3.5; Sodium 147   Recent Lipid Panel No results found for: CHOL, TRIG, HDL, CHOLHDL, VLDL, LDLCALC, LDLDIRECT  Physical Exam:   VS:  BP 120/76   Pulse 82   Ht 4\' 11"  (1.499 m)   Wt 207 lb (93.9 kg)   SpO2 97%   BMI 41.81 kg/m    Wt Readings from Last 3 Encounters:  08/10/20 207 lb (93.9 kg)  06/03/20 204 lb 3.2 oz (92.6 kg)  03/23/20 205 lb 6.4 oz (93.2 kg)    General: Well nourished, well developed, in no acute distress Head: Atraumatic, normal size  Eyes: PEERLA, EOMI  Neck: Supple, no JVD Endocrine: No thryomegaly Cardiac: Normal S1, S2; RRR; no murmurs, rubs, or gallops Lungs: Clear to auscultation bilaterally, no wheezing, rhonchi or rales  Abd: Soft, nontender, no hepatomegaly  Ext: No edema, pulses 2+ Musculoskeletal: No deformities, BUE and BLE strength normal and equal Skin: Warm and dry, no rashes   Neuro: Alert and oriented to person, place, time, and situation, CNII-XII grossly intact, no focal deficits  Psych: Normal mood and affect   ASSESSMENT:   Tiffany Gilmore is a 75 y.o. female who presents for the following: 1. OSA (obstructive sleep apnea)   2. Snoring   3. Primary hypertension     PLAN:   1. OSA (obstructive sleep apnea) 2. Snoring -Moderate sleep apnea.  Awaiting her CPAP machine.  Parts are on backorder.  Hopefully this will happen soon.  3. Primary hypertension -BP is well controlled.  She will continue losartan 50 mg daily.  She can stop checking as her blood pressure is now controlled.  I recommended regular exercise and proper diet.  She should also pursue salt reductive strategies.  Disposition: Return in about 1 year (around 08/10/2021).  Medication Adjustments/Labs and Tests Ordered: Current medicines are reviewed at length with the patient today.  Concerns regarding  medicines are outlined above.  No orders of the defined types were placed in this encounter.  No orders of the defined types were placed in this encounter.   Patient Instructions  Medication Instructions:  The current medical regimen is effective;  continue present plan and medications.  *If you need a refill on your cardiac medications before your next appointment, please call your pharmacy*   Follow-Up: At [facility], you and your health needs are our priority.  As part of our continuing mission to provide you with exceptional heart care, we have created designated Provider Care Teams.  These Care Teams include your primary Cardiologist (physician) and Advanced Practice Providers (APPs -  Physician Assistants and Nurse Practitioners) who all work together to provide you with the care you need, when you need it.  We recommend signing up for the patient portal called "MyChart".  Sign up information is provided on this After Visit Summary.  MyChart is used to connect with patients for Virtual Visits (Telemedicine).  Patients are able to view lab/test results, encounter notes, upcoming appointments, etc.  Non-urgent messages can be sent to your provider as well.   To learn more about what you can do with MyChart, go to [email or web address].    Your next appointment:   12 month(s)  The format for your next appointment:   In Person  Provider:   You will see one of the following Advanced Practice Providers on your designated Care Team:   Almyra Deforest, PA-C Sande Rives, [place]     Time Spent with Patient: I have spent a total of 25 minutes with patient reviewing hospital notes, telemetry, EKGs, labs and examining the patient as well as establishing an assessment and plan that was discussed with the patient.  > 50% of time was spent in direct patient care.  Signed, Addison Naegeli. Audie Box, MD, [place]  [address], [place], [place] [address] [phone]  08/10/2020 4:01 PM

## 2020-08-10 ENCOUNTER — Ambulatory Visit (INDEPENDENT_AMBULATORY_CARE_PROVIDER_SITE_OTHER): Payer: Medicare Other | Admitting: Cardiovascular Disease

## 2020-08-10 ENCOUNTER — Encounter: Payer: Self-pay | Admitting: Cardiovascular Disease

## 2020-08-10 ENCOUNTER — Other Ambulatory Visit: Payer: Self-pay

## 2020-08-10 VITALS — BP 120/76 | HR 82 | Ht 59.0 in | Wt 207.0 lb

## 2020-08-10 DIAGNOSIS — R0683 Snoring: Secondary | ICD-10-CM

## 2020-08-10 DIAGNOSIS — I10 Essential (primary) hypertension: Secondary | ICD-10-CM | POA: Diagnosis not present

## 2020-08-10 DIAGNOSIS — G4733 Obstructive sleep apnea (adult) (pediatric): Secondary | ICD-10-CM

## 2020-08-10 NOTE — Patient Instructions (Signed)
Medication Instructions:  The current medical regimen is effective;  continue present plan and medications.  *If you need a refill on your cardiac medications before your next appointment, please call your pharmacy*   Follow-Up: At [facility], you and your health needs are our priority.  As part of our continuing mission to provide you with exceptional heart care, we have created designated Provider Care Teams.  These Care Teams include your primary Cardiologist (physician) and Advanced Practice Providers (APPs -  Physician Assistants and Nurse Practitioners) who all work together to provide you with the care you need, when you need it.  We recommend signing up for the patient portal called "MyChart".  Sign up information is provided on this After Visit Summary.  MyChart is used to connect with patients for Virtual Visits (Telemedicine).  Patients are able to view lab/test results, encounter notes, upcoming appointments, etc.  Non-urgent messages can be sent to your provider as well.   To learn more about what you can do with MyChart, go to [email or web address].    Your next appointment:   12 month(s)  The format for your next appointment:   In Person  Provider:   You will see one of the following Advanced Practice Providers on your designated Care Team:   Almyra Deforest, PA-C Sande Rives, [place]

## 2020-09-21 ENCOUNTER — Emergency Department (HOSPITAL_BASED_OUTPATIENT_CLINIC_OR_DEPARTMENT_OTHER): Payer: Medicare Other

## 2020-09-21 ENCOUNTER — Emergency Department (HOSPITAL_BASED_OUTPATIENT_CLINIC_OR_DEPARTMENT_OTHER)
Admission: EM | Admit: 2020-09-21 | Discharge: 2020-09-21 | Disposition: A | Discharge status: Home / Self Care | Payer: Medicare Other | Attending: Emergency Medicine | Admitting: Emergency Medicine

## 2020-09-21 ENCOUNTER — Other Ambulatory Visit: Payer: Self-pay | Admitting: Gynecologic Oncology

## 2020-09-21 ENCOUNTER — Telehealth: Payer: Self-pay

## 2020-09-21 ENCOUNTER — Encounter (HOSPITAL_BASED_OUTPATIENT_CLINIC_OR_DEPARTMENT_OTHER): Payer: Self-pay | Admitting: *Deleted

## 2020-09-21 ENCOUNTER — Other Ambulatory Visit: Payer: Self-pay

## 2020-09-21 DIAGNOSIS — Z90710 Acquired absence of both cervix and uterus: Secondary | ICD-10-CM | POA: Diagnosis not present

## 2020-09-21 DIAGNOSIS — T148XXA Other injury of unspecified body region, initial encounter: Secondary | ICD-10-CM

## 2020-09-21 DIAGNOSIS — I10 Essential (primary) hypertension: Secondary | ICD-10-CM | POA: Insufficient documentation

## 2020-09-21 DIAGNOSIS — Z853 Personal history of malignant neoplasm of breast: Secondary | ICD-10-CM | POA: Insufficient documentation

## 2020-09-21 DIAGNOSIS — R1031 Right lower quadrant pain: Secondary | ICD-10-CM

## 2020-09-21 DIAGNOSIS — Z79899 Other long term (current) drug therapy: Secondary | ICD-10-CM | POA: Insufficient documentation

## 2020-09-21 DIAGNOSIS — S39011A Strain of muscle, fascia and tendon of abdomen, initial encounter: Secondary | ICD-10-CM | POA: Diagnosis not present

## 2020-09-21 DIAGNOSIS — S3991XA Unspecified injury of abdomen, initial encounter: Secondary | ICD-10-CM | POA: Diagnosis present

## 2020-09-21 DIAGNOSIS — K7689 Other specified diseases of liver: Secondary | ICD-10-CM | POA: Diagnosis not present

## 2020-09-21 DIAGNOSIS — X500XXA Overexertion from strenuous movement or load, initial encounter: Secondary | ICD-10-CM | POA: Diagnosis not present

## 2020-09-21 DIAGNOSIS — Z8542 Personal history of malignant neoplasm of other parts of uterus: Secondary | ICD-10-CM | POA: Diagnosis not present

## 2020-09-21 LAB — LIPASE, BLOOD: Lipase: 23 U/L (ref 11–51)

## 2020-09-21 LAB — URINALYSIS, ROUTINE W REFLEX MICROSCOPIC
Bilirubin Urine: NEGATIVE
Glucose, UA: NEGATIVE mg/dL
Hgb urine dipstick: NEGATIVE
Ketones, ur: NEGATIVE mg/dL
Nitrite: NEGATIVE
Protein, ur: NEGATIVE mg/dL
Specific Gravity, Urine: 1.01 (ref 1.005–1.030)
pH: 6 (ref 5.0–8.0)

## 2020-09-21 LAB — COMPREHENSIVE METABOLIC PANEL
ALT: 19 U/L (ref 0–44)
AST: 28 U/L (ref 15–41)
Albumin: 3.9 g/dL (ref 3.5–5.0)
Alkaline Phosphatase: 76 U/L (ref 38–126)
Anion gap: 7 (ref 5–15)
BUN: 9 mg/dL (ref 8–23)
CO2: 26 mmol/L (ref 22–32)
Calcium: 8.9 mg/dL (ref 8.9–10.3)
Chloride: 107 mmol/L (ref 98–111)
Creatinine, Ser: 0.79 mg/dL (ref 0.44–1.00)
GFR, Estimated: >60 mL/min (ref >60)
Glucose, Bld: 96 mg/dL (ref 70–99)
Potassium: 4.1 mmol/L (ref 3.5–5.1)
Sodium: 140 mmol/L (ref 135–145)
Total Bilirubin: 0.7 mg/dL (ref 0.3–1.2)
Total Protein: 7.5 g/dL (ref 6.5–8.1)

## 2020-09-21 LAB — URINALYSIS, MICROSCOPIC (REFLEX)

## 2020-09-21 LAB — CBC
HCT: 41.2 % (ref 36.0–46.0)
Hemoglobin: 13.6 g/dL (ref 12.0–15.0)
MCH: 28.5 pg (ref 26.0–34.0)
MCHC: 33 g/dL (ref 30.0–36.0)
MCV: 86.2 fL (ref 80.0–100.0)
Platelets: 198 10*3/uL (ref 150–400)
RBC: 4.78 MIL/uL (ref 3.87–5.11)
RDW: 12.8 % (ref 11.5–15.5)
WBC: 4.3 10*3/uL (ref 4.0–10.5)
nRBC: 0 % (ref 0.0–0.2)

## 2020-09-21 MED ORDER — IOHEXOL 300 MG/ML  SOLN
75.0000 mL | Freq: Once | INTRAMUSCULAR | Status: AC | PRN
  Administered 2020-09-21: 75 mL via INTRAVENOUS

## 2020-09-21 NOTE — ED Notes (Signed)
Attempted IV on Pt. And she asked for US guided IV to be started instead of IV started by RN other than myself.  Richardson Landry Resp. to do [place] IV.

## 2020-09-21 NOTE — Telephone Encounter (Signed)
Received phone call from Ms. Merlos regarding pain. Patient states she started having pain in her right lower abdomen this past weekend that would come and go. However the pain has been constant today. She is laying on a heating pad which has provided some relief. She has not taken tylenol or advil. Patient reports it is now a sharp constant pain. Denies fever, chills, N/V. Reports normal appetite and bowel habits.  Joylene John, NP has ordered a CT scan. Instructed patient that she will receive a call with appointment details for the scan. If pain increases or she begins to feel worse to seek care at the emergency room. Patient verbalized understanding.

## 2020-09-21 NOTE — Progress Notes (Signed)
See RN note. New RLQ pain. CT to evaluate for signs of recurrence.

## 2020-09-21 NOTE — ED Notes (Signed)
RUQ abd. Pain x1 week.  Denies N/V/D   Eating/drinking per normal

## 2020-09-21 NOTE — ED Triage Notes (Signed)
Right mid abdominal pain x 3 days. No other symptoms.

## 2020-09-21 NOTE — ED Notes (Signed)
Pt. returns from CT. CT unable to perform scan 2/2 IV site infiltration. No contrast media infused into pt's arm per CT tech.

## 2020-09-21 NOTE — ED Provider Notes (Signed)
[facility] EMERGENCY DEPARTMENT Provider Note   CSN: [id number] Arrival date & time: 09/21/20  1842     History Chief Complaint  Patient presents with   Abdominal Pain    Tiffany Gilmore is a 75 y.o. female.  HPI     75yo female with history of endometrial cancer, SVT, hypertension, hyperlipidemia, presents with concern for abdominal pain for 3 days.    Reports she developed right middle-lower abdominal pain 3 days ago. Waxing and waning, constant dull aching with episodes of sharp pain. Husband reports she had been lifting heavy files. Denies nausea, vomiting, diarrhea, fevers, urinary symptoms, vaginal bleeding or discharge. Just reports having the pain. No hx of pain like this. Nothing seems to make it better or worse. Not worse with eating appetite normal.    Past Medical History:  Diagnosis Date   Arthritis    Cancer ([place])    breast R   Cataract    Endometrial cancer ([place])    GERD (gastroesophageal reflux disease)    Hyperlipidemia    Hypertension    SVT (supraventricular tachycardia) ([place])    Ablation in 2002 to treat    Patient Active Problem List   Diagnosis Date Noted   Other chest pain 01/13/2020   Snoring 01/13/2020   Fatigue 01/13/2020   Morbid obesity ([place]) 11/18/2019   Pelvic adhesions    Endometrial adenocarcinoma ([place]) 10/29/2019   COLONIC POLYPS 06/18/2007   GERD 06/18/2007   RECTAL BLEEDING 06/18/2007   UNSPECIFIED DISORDER OF LIVER 06/18/2007    Past Surgical History:  Procedure Laterality Date   ABLATION     Cardiac ablation   BREAST SURGERY     CATARACT EXTRACTION W/ INTRAOCULAR LENS IMPLANT     CESAREAN SECTION     EYE SURGERY     ROBOTIC ASSISTED LAPAROSCOPIC LYSIS OF ADHESION N/A 11/18/2019   Procedure: XI ROBOTIC ASSISTED LAPAROSCOPIC LYSIS OF ADHESION;  Surgeon: Everitt Amber, MD;  [facility];  Service: Gynecology;  Laterality: N/A;   ROBOTIC ASSISTED TOTAL HYSTERECTOMY WITH BILATERAL SALPINGO OOPHERECTOMY Bilateral  11/18/2019   Procedure: XI ROBOTIC ASSISTED TOTAL HYSTERECTOMY WITH BILATERAL SALPINGO OOPHORECTOMY;  Surgeon: Everitt Amber, MD;  [facility];  Service: Gynecology;  Laterality: Bilateral;   SENTINEL NODE BIOPSY N/A 11/18/2019   Procedure: SENTINEL NODE BIOPSY;  Surgeon: Everitt Amber, MD;  [facility];  Service: Gynecology;  Laterality: N/A;     OB History   No obstetric history on file.     Family History  Problem Relation Age of Onset   Heart disease Brother     Social History   Tobacco Use   Smoking status: Never   Smokeless tobacco: Never  Vaping Use   Vaping Use: Never used  Substance Use Topics   Alcohol use: No   Drug use: No    Home Medications Prior to Admission medications   Medication Sig Start Date End Date Taking? Authorizing Provider  fish oil-omega-3 fatty acids 1000 MG capsule Take 2 g by mouth daily.    Yes [provider]  losartan (COZAAR) 50 MG tablet Take 50 mg by mouth daily.   Yes [provider]    Allergies    Aspirin, Penicillin g, and Sulfa antibiotics  Review of Systems   Review of Systems  Constitutional:  Negative for fever.  HENT:  Negative for sore throat.   Eyes:  Negative for visual disturbance.  Respiratory:  Negative for cough and shortness of breath.   Cardiovascular:  Negative for chest pain.  Gastrointestinal:  Positive for abdominal pain. Negative for nausea and vomiting.  Genitourinary:  Negative for difficulty urinating.  Musculoskeletal:  Negative for back pain and neck pain.  Skin:  Negative for rash.  Neurological:  Negative for syncope and headaches.   Physical Exam Updated Vital Signs BP (!) 154/82 (BP Location: Right Arm)   Pulse 85   Temp 98.4 F (36.9 C) (Oral)   Resp 15   Ht '4\' 11"'$  (1.499 m)   Wt 94.3 kg   SpO2 98%   BMI 41.97 kg/m   Physical Exam Vitals and nursing note reviewed.  Constitutional:      General: She is not in acute distress.    Appearance: She is  well-developed. She is not diaphoretic.  HENT:     Head: Normocephalic and atraumatic.  Eyes:     Conjunctiva/sclera: Conjunctivae normal.  Cardiovascular:     Rate and Rhythm: Normal rate and regular rhythm.     Heart sounds: Normal heart sounds. No murmur heard.   No friction rub. No gallop.  Pulmonary:     Effort: Pulmonary effort is normal. No respiratory distress.     Breath sounds: Normal breath sounds. No wheezing or rales.  Abdominal:     General: There is no distension.     Palpations: Abdomen is soft.     Tenderness: There is abdominal tenderness in the right lower quadrant. There is no right CVA tenderness, left CVA tenderness or guarding. Positive signs include McBurney's sign. Negative signs include Murphy's sign.  Musculoskeletal:        General: No tenderness.     Cervical back: Normal range of motion.  Skin:    General: Skin is warm and dry.     Findings: No erythema or rash.  Neurological:     Mental Status: She is alert and oriented to person, place, and time.    ED Results / Procedures / Treatments   Labs (all labs ordered are listed, but only abnormal results are displayed) Labs Reviewed  URINALYSIS, ROUTINE W REFLEX MICROSCOPIC - Abnormal; Notable for the following components:      Result Value   Leukocytes,Ua TRACE (*)    All other components within normal limits  URINALYSIS, MICROSCOPIC (REFLEX) - Abnormal; Notable for the following components:   Bacteria, UA RARE (*)    All other components within normal limits  LIPASE, BLOOD  COMPREHENSIVE METABOLIC PANEL  CBC    EKG None  Radiology CT ABDOMEN PELVIS WO CONTRAST  Result Date: 09/21/2020 CLINICAL DATA:  Right lower quadrant pain EXAM: CT ABDOMEN AND PELVIS WITHOUT CONTRAST TECHNIQUE: Multidetector CT imaging of the abdomen and pelvis was performed following the standard protocol without IV contrast. COMPARISON:  09/14/2017 FINDINGS: Lower chest: No acute abnormality Hepatobiliary: Stable  hypodensity anteriorly in the right hepatic lobe compatible with small cyst. No suspicious focal hepatic abnormality. Gallbladder unremarkable. Pancreas: No focal abnormality or ductal dilatation. Spleen: No focal abnormality.  Normal size. Adrenals/Urinary Tract: No adrenal abnormality. No focal renal abnormality. No stones or hydronephrosis. Urinary bladder is unremarkable. Stomach/Bowel: Normal appendix. Stomach, large and small bowel grossly unremarkable. Vascular/Lymphatic: No evidence of aneurysm or adenopathy. Reproductive: Prior hysterectomy.  No adnexal masses. Other: No free fluid or free air. Musculoskeletal: No acute bony abnormality. IMPRESSION: Normal appendix. No acute findings in the abdomen or pelvis. Electronically Signed   By: Rolm Baptise M.D.   On: 09/21/2020 22:17    Procedures Procedures   Medications Ordered in  ED Medications  iohexol (OMNIPAQUE) 300 MG/ML solution 75 mL (75 mLs Intravenous Contrast Given 09/21/20 2107)    ED Course  I have reviewed the triage vital signs and the nursing notes.  Pertinent labs & imaging results that were available during my care of the patient were reviewed by me and considered in my medical decision making (see chart for details).    MDM Rules/Calculators/A&P                            75yo female with history of endometrial cancer, SVT, hypertension, hyperlipidemia, presents with concern for abdominal pain for 3 days.  DDx includes appendicitis, pancreatitis, cholecystitis, pyelonephritis, nephrolithiasis, diverticulitis, AAA, dissection.   CT abdomen pelvis WO contrast completed given loss of IV shows no acute findings, and do not feel hx/exam consistent with mesenteric ischemia or acute vascular pathology. Labs without signs of pancreatitis, UTI.  Has been lifting recently, consider muscular strain> Recommend follow up also with GYN given endometrial cancer hx.  Patient discharged in stable condition with understanding of reasons to  return.   Final Clinical Impression(s) / ED Diagnoses Final diagnoses:  Right lower quadrant abdominal pain  Muscle strain    Rx / DC Orders ED Discharge Orders     None        Gareth Morgan, MD 09/22/20 2239

## 2020-09-23 ENCOUNTER — Telehealth: Payer: Self-pay

## 2020-09-23 DIAGNOSIS — R1031 Right lower quadrant pain: Secondary | ICD-10-CM | POA: Diagnosis not present

## 2020-09-23 NOTE — Telephone Encounter (Signed)
Tiffany Gilmore states that her abdominal pain has greatly decreased since her ED visit.  She states she has not needed to take tylenol or ibuprofen for the pain.  She states that she has some soreness  around her umbilicus. She has an appointment today with her PCP Dr. Ashby Dawes to follow up with pain. Pt appreciated the follow up call.

## 2020-09-29 ENCOUNTER — Ambulatory Visit (HOSPITAL_COMMUNITY): Payer: Medicare Other

## 2020-10-06 DIAGNOSIS — N644 Mastodynia: Secondary | ICD-10-CM | POA: Diagnosis not present

## 2020-11-16 ENCOUNTER — Other Ambulatory Visit: Payer: Self-pay | Admitting: Cardiovascular Disease

## 2020-12-08 DIAGNOSIS — N3000 Acute cystitis without hematuria: Secondary | ICD-10-CM | POA: Diagnosis not present

## 2020-12-14 ENCOUNTER — Telehealth: Payer: Self-pay | Admitting: *Deleted

## 2020-12-14 NOTE — Telephone Encounter (Signed)
Patient called and scheduled a follow up appt on December 7th with Dr Delsa Sale

## 2021-01-04 ENCOUNTER — Encounter: Payer: Self-pay | Admitting: Obstetrics & Gynecology

## 2021-01-05 ENCOUNTER — Other Ambulatory Visit: Payer: Self-pay

## 2021-01-05 ENCOUNTER — Inpatient Hospital Stay: Payer: Medicare Other | Attending: Obstetrics & Gynecology | Admitting: Obstetrics & Gynecology

## 2021-01-05 VITALS — BP 165/67 | HR 89 | Temp 97.6°F | Resp 16 | Ht 59.0 in | Wt 209.0 lb

## 2021-01-05 DIAGNOSIS — Z8542 Personal history of malignant neoplasm of other parts of uterus: Secondary | ICD-10-CM | POA: Diagnosis not present

## 2021-01-05 DIAGNOSIS — C541 Malignant neoplasm of endometrium: Secondary | ICD-10-CM

## 2021-01-05 NOTE — Patient Instructions (Addendum)
Return in 6 months.  Anterior Cutaneous Nerve Entrapment Syndrome (ACNES) What is ACNES? Abdominal Cutaneous Nerve Entrapment Syndrome (ACNES) is one of the now recognised causes of chronic abdominal pain. It remains an overwhelmingly underdiagnosed condition and consequently not readily managed or recognised. It is generally characterised by patients presenting with a severe, often refractory, chronic abdominal pain just lateral (next to) the midline. Often the pain can be pinpointed to a specific location on the abdomen. It is theorised that the cutaneous branches of the lower thoracoabdominal intercostal nerves are 'trapped' at the lateral border of the rectus abdominis muscle. It may follow previous surgeries on the abdomen but sometimes a cause is not found.  What are the symptoms?  Pain is experienced near the middle of the abdomen usually on one side only and, while usually localised, it may radiate between the lower border of the ribs down to just above the groin. The pain is not related to any gastrointestinal symptoms such as diarrhoea, constipation or other altered bowel habit with or without weight loss. It may be related to movement or exercise. The prevalence of the syndrome is said to range between 15% and 30% depending on the definition and the diagnostic criteria that is used. In adolescents, it is reported to be diagnosed in one out of eight cases of chronic abdominal pain. In the emergency department, the prevalence of ACNES in the patients presenting with acute abdominal pain has been reported to be a mere 2% of cases as usually there is the presence of a serious acute pathology in such patients.  Clinically when examining a patient, pain at the point of tenderness can be palpated when the abdomen is soft and if the patient is asked to 'sit up' and hold this position i.e tensing the abdominal muscles; if this makes the pain worse then this is a positive Carnett's test and more in keeping  with ACNES, if not then the test is negative. It is important to exclude an underlying abdominal or gastrointestinal condition before the diagnosis of ACNES can be made.  In summary: Diagnosis can be made when a patient presents with the following as described below (although not all need to be present)  Unilateral locoregional pain at the abdominal wall lasting for at least 1 month The presence of a unilateral tender spot at the abdominal wall (a trigger point of <2 cm2 fingertip area of maximal tenderness, localized at the lateral border of the rectus abdominis) A positive Carnett's test A positive skin pinch test and/or altered skin perception to light touch and/or cold at the area of the most intense pain Normal laboratory findings with no indication of inflammation or infection, and in the absence of any surgical cause of pain Negative imaging of the abdominal wall Temporary positive relief in pain response after injecting a local anaesthetic (usually lidocaine) at the diagnostic trigger point Treatment options Investigation may initially be managed by a gastrointestinal specialist clinician to ensure that there is no other pathology present, which may account for the abdominal pain experienced; this may involve the use of endoscopic examinations, a CAT scan of the abdomen as well as blood tests. Injections into the muscles/nerves at the affected site of pain, can also aid in pain relief and also may confirm and diagnose the condition. Treatment such as pulsed radiofrequency will be often offered when these injections are positive, and is described below. Pain killers may be used in a step wise fashion and rely on the consultation and expertise  of a pain specialist. Some of the painkillers used may be quite strong and lead to a patient becoming constipated, so the use of laxatives is sometimes also required. Chemical treatment by using various 'injections' in some cases with stronger agents such as  botox also shown to have result.  Pain Medicine specialists may suggest 'pulsed radiofrequency' which involves using ultrasound to place an electrical probe between the muscles in the abdominal wall where the nerves lie. Once the position is correct then an electrical treatment is provided to the nerve to attempt to reduce the pain, without burning or damaging it.  Surgery: Generally relies on the use of a small incision just at the point of the pain and dividing the nerves that pierce the abdominal wall to supply the overlying skin (cutaneous nerves). This procedure is undertaken with the patient asleep (under general anaesthetic). The procedure will leave a scar and may be undertaken as a day case. [facility] do not provide this surgery, which is usually offered by onward referral to a peripheral nerve unit for assessment of the situation, when other therapies might have assisted but not been successful in the long term.  Managing the pain  Once a diagnosis is made a management plan can be considered. Initially injecting a local anaesthetic (usually lidocaine) at the diagnostic trigger point provides a temporary relief in pain response for at least by 50% or more individuals and can be helpful in making the diagnosis and has been reported to provide long term relief. Painkillers can be used and may include pain medication which is specifically designed for nerve pain such as amitriptyline and gabapentin as well as the use of anti-inflammatory medication. A pain specialist can assess and guide on the prescription of these medications as well closely following up how effective the medication is in controlling the pain experienced.  In summary, pain can be managed in a multidisciplinary fashion, which is key for any individual presenting with ACNES and relies of the use of painkillers, injections and potentially for consideration of surgery. Surgery is not frequently carried out but is usually managed by  a peripheral nerve surgeon or abdominal wall specialist surgeon.  Evidence/ Success Large centres carrying out the operation for ACNES have shown good results and quote a 70% success rate at one year. Some studies have shown pain free results in 86% of patients with over 75% experiencing long-term success. About 2/3rd of patients do get better with an improvement in their quality of life as well a reduction in the use of as well as in many cases cessation of pain killers they are taking. Other recommended treatments include pulsed radiofrequency ablation and localised injections as described above.  Treatment description  Pain killers including morphine in some cases Trigger point injections and nerve blocks around the site of pain Pulsed radiofrequency ablation Recovery and rehabilitation  It is important to ensure that you receive good rehabilitation advice from your treating clinician and if you have seen a physiotherapist in the past it is worth continuing with some physio and rehab after your treatment.

## 2021-01-05 NOTE — Assessment & Plan Note (Addendum)
H/O stage IA grade 1 endometrial cancer (MMRd, MLH1 hypermethylation present). S/p staging surgery in September, 2021. ?Etiology of abdominal pain--suspect abdominal wall issue(eg ACNES).  Unremarkable exam  >encouraged to f/u w/PCP re: further evaluation/management of possible abdominal wall pain >continue to have follow-up every 6 months for 5 years in accordance with NCCN guidelines.

## 2021-01-05 NOTE — Progress Notes (Signed)
Follow Up Note: Gyn-Onc  Tiffany Gilmore 75 y.o. female  CC: She presents for a f/u visit   HPI: The oncology history was reviewed.  Interval History: She denies any vaginal bleeding, cough, lethargy or increasing abdominal girth. She endorses episodic, vague abdominal pain.  A CT abd/pelvis in 8/22 was negative for metastatic disease.    Review of Systems  Review of Systems  Constitutional:  Negative for malaise/fatigue and weight loss.  Respiratory:  Negative for shortness of breath and wheezing.   Cardiovascular:  Negative for chest pain and leg swelling.  Gastrointestinal:  Negative for abdominal pain, blood in stool, constipation, nausea and vomiting.  Genitourinary:  Negative for dysuria, frequency, hematuria and urgency.  Musculoskeletal:  Negative for joint pain and myalgias.  Neurological:  Negative for weakness.  Psychiatric/Behavioral:  Negative for depression. The patient does not have insomnia.    Current medications, allergy, social history, past surgical history, past medical history, family history were all reviewed.    Vitals:  BP (!) 165/67 (BP Location: Left Arm, Patient Position: Sitting)   Pulse 89   Temp 97.6 F (36.4 C) (Tympanic)   Resp 16   Ht _0  (1.499 m)   Wt 209 lb (94.8 kg)   SpO2 98%   BMI 42.21 kg/m     Physical Exam Exam conducted with a chaperone present.  Constitutional:      General: She is not in acute distress. Cardiovascular:     Rate and Rhythm: Normal rate and regular rhythm.  Pulmonary:     Effort: Pulmonary effort is normal.     Breath sounds: Normal breath sounds. No wheezing or rhonchi.  Abdominal:     Palpations: Abdomen is soft.     Tenderness: There is no abdominal tenderness. There is no right CVA tenderness or left CVA tenderness.     Hernia: No hernia is present.  Genitourinary:    General: Normal vulva.     Urethra: No urethral lesion.     Vagina: No lesions. No bleeding. Foreshortened Musculoskeletal:      Cervical back: Neck supple.     Right lower leg: No edema.     Left lower leg: No edema.  Lymphadenopathy:     Upper Body:     Right upper body: No supraclavicular adenopathy.     Left upper body: No supraclavicular adenopathy.     Lower Body: No right inguinal adenopathy. No left inguinal adenopathy.  Skin:    Findings: No rash.  Neurological:     Mental Status: She is oriented to person, place, and time.   Assessment/Plan: Endometrial adenocarcinoma ([place]) H/O stage IA grade 1 endometrial cancer (MMRd, MLH1 hypermethylation present). S/p staging surgery in September, 2021.  ?Etiology of abdominal pain--suspect abdominal wall issue(eg ACNES).  Unremarkable exam  >encouraged to f/u w/PCP re: further evaluation/management of possible abdominal wall pain  >continue to have follow-up every 6 months for 5 years in accordance with NCCN guidelines.     Lahoma Crocker, MD

## 2021-01-06 ENCOUNTER — Encounter: Payer: Self-pay | Admitting: Obstetrics & Gynecology

## 2021-01-13 ENCOUNTER — Other Ambulatory Visit: Payer: Self-pay

## 2021-01-13 ENCOUNTER — Encounter: Payer: Self-pay | Admitting: Cardiovascular Disease

## 2021-01-13 ENCOUNTER — Ambulatory Visit (INDEPENDENT_AMBULATORY_CARE_PROVIDER_SITE_OTHER): Payer: Medicare Other | Admitting: Cardiovascular Disease

## 2021-01-13 VITALS — BP 150/68 | HR 80 | Ht 59.0 in | Wt 210.6 lb

## 2021-01-13 DIAGNOSIS — I10 Essential (primary) hypertension: Secondary | ICD-10-CM

## 2021-01-13 DIAGNOSIS — R5383 Other fatigue: Secondary | ICD-10-CM

## 2021-01-13 DIAGNOSIS — R0683 Snoring: Secondary | ICD-10-CM | POA: Diagnosis not present

## 2021-01-13 DIAGNOSIS — G4733 Obstructive sleep apnea (adult) (pediatric): Secondary | ICD-10-CM

## 2021-01-13 NOTE — Progress Notes (Signed)
Cardiology Office Note    Date:  01/24/2021   ID:  Tiffany Gilmore, DOB Dec 30, 1945, MRN [id number]  PCP:  Tiffany Seashore, MD  Cardiologist:  Tiffany Majestic, MD (sleep); Dr. Glenice Gilmore  New sleep evaluation   History of Present Illness:  Tiffany Gilmore is a 75 y.o. female who is followed by Dr. Grayling Gilmore for cardiology care and Dr. Felicie Gilmore on for primary care.  She has a history of hypertension, hyperlipidemia, and due to concerns for obstructive sleep apnea referred for a sleep study which was done as a PSG on January 13, 2020.  Symptoms included snoring, frequent awakenings, awakening gasping for breath on her back, daytime somnolence, fatigue, hypertension and obesity.  She was found to have moderate overall sleep apnea with an AHI of 23 but respiratory disturbance was further increased to 35.1.  She had severe sleep apnea during REM sleep with an AHI of 63.4.  She had significant oxygen desaturation to nadir 78%.  She underwent a CPAP titration study on March 11, 2020.  CPAP was initiated at 16 cm and was titrated up to 18 cm of water.  AHI was 0.41 minutes of REM sleep at 18 cm and 18 nadir was 89%.  It was initially recommended for her to start CPAP auto therapy with an EPR of 3 and a 15 to 20 cm range.  Her CPAP set up date was October 19, 2020 when she received a new ResMed air sense 11 AutoSet unit with [employer] as her DME company.  Download was obtained in the office today from November 15 through January 12, 2021.  She is meeting compliance with usage days at 100%.  She is compliant with usage greater than 4 hours with average use of 5 hours and 53 minutes.  Her AHI is 1.9 and her 95th percentile pressure 16.6 with a maximum average pressure of 17.2.  She typically goes to bed between 430 and 5 AM and often wakes up between 9 and 10:30 AM.  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  She usually starts work at home around noon and then works in the  office from 3:30 until 11 PM but at times she still has work to do when she goes home.  Since initiating CPAP therapy, her sleep is much improved.  She denies any daytime sleepiness and Epworth scale score was calculated in the office today and this endorsed at 6.  She is unaware of breakthrough snoring.  She has more energy.  She is on losartan 50 mg daily for hypertension and takes omega-3 fatty acid.  She presents for her initial sleep evaluation.  Past Medical History:  Diagnosis Date   Arthritis    Cancer ([place])    breast R   Cataract    Endometrial cancer ([place])    GERD (gastroesophageal reflux disease)    Hyperlipidemia    Hypertension    SVT (supraventricular tachycardia) ([place])    Ablation in 2002 to treat    Past Surgical History:  Procedure Laterality Date   ABLATION     Cardiac ablation   BREAST SURGERY     CATARACT EXTRACTION W/ INTRAOCULAR LENS IMPLANT     CESAREAN SECTION     EYE SURGERY     ROBOTIC ASSISTED LAPAROSCOPIC LYSIS OF ADHESION N/A 11/18/2019   Procedure: XI ROBOTIC ASSISTED LAPAROSCOPIC LYSIS OF ADHESION;  Surgeon: Everitt Amber, MD;  Location: [facility];  Service: Gynecology;  Laterality: N/A;   ROBOTIC ASSISTED TOTAL HYSTERECTOMY WITH BILATERAL SALPINGO OOPHERECTOMY Bilateral 11/18/2019   Procedure: XI ROBOTIC ASSISTED TOTAL HYSTERECTOMY WITH BILATERAL SALPINGO OOPHORECTOMY;  Surgeon: Everitt Amber, MD;  Location: [facility];  Service: Gynecology;  Laterality: Bilateral;   SENTINEL NODE BIOPSY N/A 11/18/2019   Procedure: SENTINEL NODE BIOPSY;  Surgeon: Everitt Amber, MD;  Location: [facility];  Service: Gynecology;  Laterality: N/A;    Current Medications: Outpatient Medications Prior to Visit  Medication Sig Dispense Refill   fish oil-omega-3 fatty acids 1000 MG capsule Take 2 g by mouth daily.      losartan (COZAAR) 50 MG tablet TAKE 1 TABLET(50 MG) BY MOUTH DAILY 90 tablet 1   No facility-administered medications prior to visit.     Allergies:   Aspirin,  Penicillin g, Sulfa antibiotics, and Sulfamethoxazole-trimethoprim   Social History   Socioeconomic History   Marital status: Married    Spouse name: Not on file   Number of children: 2   Years of education: Not on file   Highest education level: Not on file  Occupational History   Occupation: working as Optometrist  Tobacco Use   Smoking status: Never   Smokeless tobacco: Never  Vaping Use   Vaping Use: Never used  Substance and Sexual Activity   Alcohol use: No   Drug use: No   Sexual activity: Yes  Other Topics Concern   Not on file  Social History Narrative   Not on file   Social Determinants of Health   Financial Resource Strain: Not on file  Food Insecurity: Not on file  Transportation Needs: Not on file  Physical Activity: Not on file  Stress: Not on file  Social Connections: Not on file    Socially, she is married for 23 years.  She has 3 children.  She is a self-employed Optometrist.  She does not exercise use tobacco or drink alcohol.   Family History:  The patient's family history includes Heart disease in her brother.   Her mother died at age 80 with colon cancer.  Her father died at age 19 with a stroke.  She has 5 living brothers and 1 deceased brother who died in his sleep at age 19.  She had 1 sister who is deceased with kidney failure at age 25.  ROS General: Negative; No fevers, chills, or night sweats;  HEENT: Negative; No changes in vision or hearing, sinus congestion, difficulty swallowing Pulmonary: Negative; No cough, wheezing, shortness of breath, hemoptysis Cardiovascular: Negative; No chest pain, presyncope, syncope, palpitations GI: Negative; No nausea, vomiting, diarrhea, or abdominal pain GU: Negative; No dysuria, hematuria, or difficulty voiding Musculoskeletal: Negative; no myalgias, joint pain, or weakness Hematologic/Oncology: Negative; no easy bruising, bleeding Endocrine: Negative; no heat/cold intolerance; no diabetes Neuro:  Negative; no changes in balance, headaches Skin: Negative; No rashes or skin lesions Psychiatric: Negative; No behavioral problems, depression Sleep: Negative; No snoring, daytime sleepiness, hypersomnolence, bruxism, restless legs, hypnogognic hallucinations, no cataplexy Other comprehensive 14 point system review is negative.   PHYSICAL EXAM:   VS:  BP (!) 150/68    Pulse 80    Ht 4' 11"  (1.499 m)    Wt 210 lb 9.6 oz (95.5 kg)    SpO2 97%    BMI 42.54 kg/m     Repeat blood pressure by me was 132/70  Wt Readings from Last 3 Encounters:  01/13/21 210 lb 9.6 oz (95.5 kg)  01/05/21 209 lb (94.8 kg)  09/21/20 207 lb 12.8 oz (  94.3 kg)    General: Alert, oriented, no distress.  Skin: normal turgor, no rashes, warm and dry HEENT: Normocephalic, atraumatic. Pupils equal round and reactive to light; sclera anicteric; extraocular muscles intact; Nose without nasal septal hypertrophy Mouth/Parynx benign; Mallinpatti scale 4 Neck: Thick neck; no JVD, no carotid bruits; normal carotid upstroke Lungs: clear to ausculatation and percussion; no wheezing or rales Chest wall: without tenderness to palpitation Heart: PMI not displaced, RRR, s1 s2 normal, 1/6 systolic murmur, no diastolic murmur, no rubs, gallops, thrills, or heaves Abdomen: soft, nontender; no hepatosplenomehaly, BS+; abdominal aorta nontender and not dilated by palpation. Back: no CVA tenderness Pulses 2+ Musculoskeletal: full range of motion, normal strength, no joint deformities Extremities: no clubbing cyanosis or edema, Homan's sign negative  Neurologic: grossly nonfocal; Cranial nerves grossly wnl Psychologic: Normal mood and affect   Studies/Labs Reviewed:   ECG (independently read by me):  NSR at 80; LAE, QTc 468 msec  Recent Labs: BMP Latest Ref Rng & Units 09/21/2020 11/23/2019 11/19/2019  Glucose 70 - 99 mg/dL 96 98 142(H)  BUN 8 - 23 mg/dL 9 12 8   Creatinine 0.44 - 1.00 mg/dL 0.79 0.87 0.73  Sodium 135 - 145  mmol/L 140 147(H) 139  Potassium 3.5 - 5.1 mmol/L 4.1 3.5 4.3  Chloride 98 - 111 mmol/L 107 108 106  CO2 22 - 32 mmol/L 26 26 25   Calcium 8.9 - 10.3 mg/dL 8.9 9.5 8.8(L)     Hepatic Function Latest Ref Rng & Units 09/21/2020 11/10/2019 10/11/2019  Total Protein 6.5 - 8.1 g/dL 7.5 7.8 8.0  Albumin 3.5 - 5.0 g/dL 3.9 4.1 4.0  AST 15 - 41 U/L 28 23 24   ALT 0 - 44 U/L 19 16 19   Alk Phosphatase 38 - 126 U/L 76 77 84  Total Bilirubin 0.3 - 1.2 mg/dL 0.7 0.8 0.6    CBC Latest Ref Rng & Units 09/21/2020 11/23/2019 11/19/2019  WBC 4.0 - 10.5 K/uL 4.3 6.1 9.0  Hemoglobin 12.0 - 15.0 g/dL 13.6 14.1 13.2  Hematocrit 36.0 - 46.0 % 41.2 42.6 38.9  Platelets 150 - 400 K/uL 198 279 273   Lab Results  Component Value Date   MCV 86.2 09/21/2020   MCV 87.7 11/23/2019   MCV 86.4 11/19/2019   No results found for: TSH No results found for: HGBA1C   BNP No results found for: BNP  ProBNP No results found for: PROBNP   Lipid Panel  No results found for: CHOL, TRIG, HDL, CHOLHDL, VLDL, LDLCALC, LDLDIRECT, LABVLDL   RADIOLOGY: No results found.   Additional studies/ records that were reviewed today include:   01/12/2021 CLINICAL INFORMATION Sleep Study Type: NPSG   Indication for sleep study: Excessive Daytime Sleepiness, Fatigue, Hypertension, Obesity, Snoring   Epworth Sleepiness Score: 3   SLEEP STUDY TECHNIQUE As per the AASM Manual for the Scoring of Sleep and Associated Events v2.3 (April 2016) with a hypopnea requiring 4% desaturations.   The channels recorded and monitored were frontal, central and occipital EEG, electrooculogram (EOG), submentalis EMG (chin), nasal and oral airflow, thoracic and abdominal wall motion, anterior tibialis EMG, snore microphone, electrocardiogram, and pulse oximetry.   MEDICATIONS aspirin 81 MG tablet fish oil-omega-3 fatty acids 1000 MG capsule losartan (COZAAR) 50 MG tablet senna-docusate (SENOKOT-S) 8.6-50 MG tablet traMADol (ULTRAM) 50  MG tablet Medications self-administered by patient taken the night of the study : N/A   SLEEP ARCHITECTURE The study was initiated at 10:22:34 PM and ended at 4:44:32 AM.  Sleep onset time was 5.4 minutes and the sleep efficiency was 79.2%%. The total sleep time was 302.5 minutes.   Stage REM latency was 94.5 minutes.   The patient spent 11.9%% of the night in stage N1 sleep, 67.4%% in stage N2 sleep, 0.0%% in stage N3 and 20.7% in REM.   Alpha intrusion was absent.   Supine sleep was 66.61%.   RESPIRATORY PARAMETERS The overall apnea/hypopnea index (AHI) was 23.0 per hour. The respiratory disturbance index (RDI) was 35.1/h. There were 47 total apneas, including 46 obstructive, 1 central and 0 mixed apneas. There were 69 hypopneas and 61 RERAs.   The AHI during Stage REM sleep was 63.4 per hour.   AHI while supine was 27.1 per hour.   The mean oxygen saturation was 94.8%. The minimum SpO2 during sleep was 78.0%.   Moderate snoring was noted during this study.   CARDIAC DATA The 2 lead EKG demonstrated sinus rhythm. The mean heart rate was 79.6 beats per minute. Other EKG findings include: PVCs.   LEG MOVEMENT DATA The total PLMS were 0 with a resulting PLMS index of 0.0. Associated arousal with leg movement index was 0.0 .   IMPRESSIONS - Moderate obstructive sleep apnea occurred during this study (AHI  23.0/h; RDI 35.1/h); however, events were very severe during REM sleep (AHI 63.4/h).). - No significant central sleep apnea occurred during this study (CAI = 0.2/h). - Significant oxygen desaturation to a nadir of 78%. - The patient snored with moderate snoring volume. - EKG findings include PVCs. - Clinically significant periodic limb movements did not occur during sleep. No significant associated arousals.   DIAGNOSIS - Obstructive Sleep Apnea (G47.33) - Nocturnal Hypoxemia (G47.36)   RECOMMENDATIONS - Therapeutic CPAP titration to determine optimal pressure required  to alleviate sleep disordered breathing. - Effort should be made to optimize nasal and oropharyngeal patency. - Positional therapy avoiding supine position during sleep. - Avoid alcohol, sedatives and other CNS depressants that may worsen sleep apnea and disrupt normal sleep architecture. - Sleep hygiene should be reviewed to assess factors that may improve sleep quality. - Weight management (BMI 41) and regular exercise should be initiated or continued if appropriate.  ___________________________________________________________________________________  03/11/2020 CLINICAL INFORMATION The patient is referred for a CPAP titration to treat sleep apnea.   Date of NPSG:  01/13/2020:  AHI 23/h; RDI 35/h; supine AHI 27/h; REM AHI 63.4/h; O2 nadir 78%.   SLEEP STUDY TECHNIQUE As per the AASM Manual for the Scoring of Sleep and Associated Events v2.3 (April 2016) with a hypopnea requiring 4% desaturations.   The channels recorded and monitored were frontal, central and occipital EEG, electrooculogram (EOG), submentalis EMG (chin), nasal and oral airflow, thoracic and abdominal wall motion, anterior tibialis EMG, snore microphone, electrocardiogram, and pulse oximetry. Continuous positive airway pressure (CPAP) was initiated at the beginning of the study and titrated to treat sleep-disordered breathing.   MEDICATIONS fish oil-omega-3 fatty acids 1000 MG capsule hydrochlorothiazide (HYDRODIURIL) 25 MG tablet losartan (COZAAR) 50 MG tablet (Expired) Medications self-administered by patient taken the night of the study : N/A   TECHNICIAN COMMENTS Comments added by technician: PT HAD ONE RESTROOM VISTED. Patient had difficulty initiating sleep. Comments added by scorer: N/A   RESPIRATORY PARAMETERS Optimal PAP Pressure (cm):  18        AHI at Optimal Pressure (/hr):            0.0 Overall Minimal O2 (%):         84.0  Supine % at Optimal Pressure (%):    100 Minimal O2 at Optimal Pressure (%):  89.0        SLEEP ARCHITECTURE The study was initiated at 9:35:49 PM and ended at 4:49:35 AM.   Sleep onset time was 20.4 minutes and the sleep efficiency was 71.1%%. The total sleep time was 308.5 minutes.   The patient spent 5.8%% of the night in stage N1 sleep, 79.3%% in stage N2 sleep, 0.0%% in stage N3 and 14.9% in REM.Stage REM latency was 365.0 minutes   Wake after sleep onset was 104.9. Alpha intrusion was absent. Supine sleep was 71.64%.   CARDIAC DATA The 2 lead EKG demonstrated sinus rhythm. The mean heart rate was 78.6 beats per minute. Other EKG findings include: rare PVC   LEG MOVEMENT DATA The total Periodic Limb Movements of Sleep (PLMS) were 0. The PLMS index was 0.0. A PLMS index of <15 is considered normal in adults.   IMPRESSIONS - CPAP was initiated at 6 cm and titrated to optimal PAP at 18 cm of water; AHI 0 with 41 minutes of REM sleep, O2 nadir 89%. - Central sleep apnea was not noted during this titration (CAI = 0.0/h). - Moderate oxygen desaturations to a nadir of 84.0% at 17 cm. - Elimination of snoring at 18 cm of water.  - Rare PVCs were observed during this study. - Clinically significant periodic limb movements were not noted during this study. Arousals associated with PLMs were rare.   DIAGNOSIS - Obstructive Sleep Apnea (G47.33)   RECOMMENDATIONS - Recommend an initial trial of CPAP Auto therapy with EPR of 3 at 15 - 20 cm H2O with heated humidification.  A Medium size Resmed Full Face Mask AirFit F20 mask was used for the titration. - Effort should be made to optimize nasal and oropharyngeal patency. - Avoid alcohol, sedatives and other CNS depressants that may worsen sleep apnea and disrupt normal sleep architecture. - Sleep hygiene should be reviewed to assess factors that may improve sleep quality. - Weight management and regular exercise should be initiated or continued. - Recommend a download in 30 days and sleep clinic evaluation after 4 weeks  of therapy  ASSESSMENT:    1. OSA (obstructive sleep apnea)   2. Snoring   3. Primary hypertension   4. Obesity, morbid, BMI 40.0-49.9 ([place])   5. Fatigue, unspecified type     PLAN:  Mrs. 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  Prior to initiating CPAP therapy, she has significant difficulty sleeping on her back, snoring, frequent awakenings, awakening gasping for breath, and nonrestorative sleep.  She has been on CPAP therapy since her set up date of October 19, 2020.  There was significant delay in getting a new ResMed air sense 11 AutoSet unit due to supply chain issues.  However since initiating CPAP therapy she has noticed marked benefit in her sleep pattern.  I reviewed her most recent download.  She is meeting compliance standards but sleep duration is less than 6 hours per night.  I had a lengthy discussion with her regarding optimal sleep duration at 7 and 9 hours.  Typically she may not be going to bed until 430  or 5 AM and often wakes up between 9 and 10:30 AM.  We discussed sleep hygiene.  On her current download with her unit is set between 15 and 20 cm of water, AHI is excellent at 1.9/h with 95th percentile pressure at 16.6 and maximum average pressure 17.2.  She is using a ResMed AirFit N 30i mask.  I had a lengthy discussion with her regarding effects of sleep apnea on normal sleep architecture.  We also discussed adverse consequences with reference to cardiovascular health particularly with blood pressure control, nocturnal arrhythmias, increased potential risk for atrial fibrillation, effects on inflammation, glucose, GERD, as well as potential for nocturnal ischemia  resulting from nocturnal hypoxemia.  BMI is consistent with morbid obesity.  We discussed the need for improved diet and exercise with weight loss.  We discussed pathophysiology associated with increased nocturia in the setting of sleep apnea.  Presently, she is doing well.  Her blood pressure on repeat by me was stable at 132/70 on her current regimen of losartan 50 mg.  Apparently she is no longer taking HCTZ.  She will follow-up with Dr. Davina Poke for her primary cardiology care.  Per Medicare requirements, I will see her in 1 year for follow-up evaluation.  If she has issues over the year she can contact me for further assessment.  Time spent 40 minutes  Medication Adjustments/Labs and Tests Ordered: Current medicines are reviewed at length with the patient today.  Concerns regarding medicines are outlined above.  Medication changes, Labs and Tests ordered today are listed in the Patient Instructions below. Patient Instructions  Medication Instructions:  Your physician recommends that you continue on your current medications as directed. Please refer to the Current Medication list given to you today.  *If you need a refill on your cardiac medications before your next appointment, please call your pharmacy*   Follow-Up: At [facility], you and your health needs are our priority.  As part of our continuing mission to provide you with exceptional heart care, we have created designated Provider Care Teams.  These Care Teams include your primary Cardiologist (physician) and Advanced Practice Providers (APPs -  Physician Assistants and Nurse Practitioners) who all work together to provide you with the care you need, when you need it.  We recommend signing up for the patient portal called "MyChart".  Sign up information is provided on this After Visit Summary.  MyChart is used to connect with patients for Virtual Visits (Telemedicine).  Patients are able to view lab/test results, encounter notes,  upcoming appointments, etc.  Non-urgent messages can be sent to your provider as well.   To learn more about what you can do with MyChart, go to [email or web address].    Your next appointment:   12 month(s)  The format for your next appointment:   In Person  Provider:   Shelva Majestic, MD   Other Instructions Sleep Clinic    Signed, Tiffany Majestic, MD  01/24/2021 11:04 AM    [facility] [address], [place], [place], [place]  [address] Phone: [phone]

## 2021-01-13 NOTE — Patient Instructions (Signed)

## 2021-01-24 ENCOUNTER — Encounter: Payer: Self-pay | Admitting: Cardiovascular Disease

## 2021-02-08 DIAGNOSIS — E559 Vitamin D deficiency, unspecified: Secondary | ICD-10-CM | POA: Diagnosis not present

## 2021-02-08 DIAGNOSIS — E782 Mixed hyperlipidemia: Secondary | ICD-10-CM | POA: Diagnosis not present

## 2021-02-08 DIAGNOSIS — I10 Essential (primary) hypertension: Secondary | ICD-10-CM | POA: Diagnosis not present

## 2021-02-08 DIAGNOSIS — L819 Disorder of pigmentation, unspecified: Secondary | ICD-10-CM | POA: Diagnosis not present

## 2021-05-23 ENCOUNTER — Other Ambulatory Visit: Payer: Self-pay | Admitting: Cardiovascular Disease

## 2021-06-10 DIAGNOSIS — Z961 Presence of intraocular lens: Secondary | ICD-10-CM | POA: Diagnosis not present

## 2021-06-28 DIAGNOSIS — R5383 Other fatigue: Secondary | ICD-10-CM | POA: Diagnosis not present

## 2021-06-28 DIAGNOSIS — I10 Essential (primary) hypertension: Secondary | ICD-10-CM | POA: Diagnosis not present

## 2021-06-28 DIAGNOSIS — E782 Mixed hyperlipidemia: Secondary | ICD-10-CM | POA: Diagnosis not present

## 2021-07-05 DIAGNOSIS — E782 Mixed hyperlipidemia: Secondary | ICD-10-CM | POA: Diagnosis not present

## 2021-07-05 DIAGNOSIS — I10 Essential (primary) hypertension: Secondary | ICD-10-CM | POA: Diagnosis not present

## 2021-07-05 DIAGNOSIS — Z5320 Procedure and treatment not carried out because of patient's decision for unspecified reasons: Secondary | ICD-10-CM | POA: Diagnosis not present

## 2021-07-05 DIAGNOSIS — D72819 Decreased white blood cell count, unspecified: Secondary | ICD-10-CM | POA: Diagnosis not present

## 2021-07-05 DIAGNOSIS — L409 Psoriasis, unspecified: Secondary | ICD-10-CM | POA: Diagnosis not present

## 2021-07-05 DIAGNOSIS — Z0000 Encounter for general adult medical examination without abnormal findings: Secondary | ICD-10-CM | POA: Diagnosis not present

## 2021-09-28 DIAGNOSIS — L209 Atopic dermatitis, unspecified: Secondary | ICD-10-CM | POA: Diagnosis not present

## 2021-09-28 DIAGNOSIS — L80 Vitiligo: Secondary | ICD-10-CM | POA: Diagnosis not present

## 2021-10-10 DIAGNOSIS — Z1231 Encounter for screening mammogram for malignant neoplasm of breast: Secondary | ICD-10-CM | POA: Diagnosis not present

## 2021-10-18 ENCOUNTER — Telehealth: Payer: Self-pay | Admitting: Cardiovascular Disease

## 2021-11-09 DIAGNOSIS — L80 Vitiligo: Secondary | ICD-10-CM | POA: Diagnosis not present

## 2021-12-20 IMAGING — US US PELVIS COMPLETE WITH TRANSVAGINAL
1 series · 13 of 25 positions shown · non-contrast
Comparison: None

CLINICAL DATA: 74-year-old presenting postmenopausal vaginal
bleeding associated with RIGHT LOWER QUADRANT abdominal
pain/RIGHT-sided pelvic pain that began yesterday. Personal history
of breast cancer in 3819.

EXAM:
TRANSABDOMINAL AND TRANSVAGINAL ULTRASOUND OF PELVIS
TECHNIQUE: Both transabdominal and transvaginal ultrasound examinations of the
pelvis were performed. Transabdominal technique was performed for
global imaging of the pelvis including uterus, ovaries, adnexal
regions, and pelvic cul-de-sac. It was necessary to proceed with
endovaginal exam following the transabdominal exam to visualize the
uterus and ovaries due to incomplete bladder distension and abundant
bowel gas in the pelvis.

[Series 1: us pelvis complete with transvaginal · 13 of 35 slices shown]
[im 1/35]
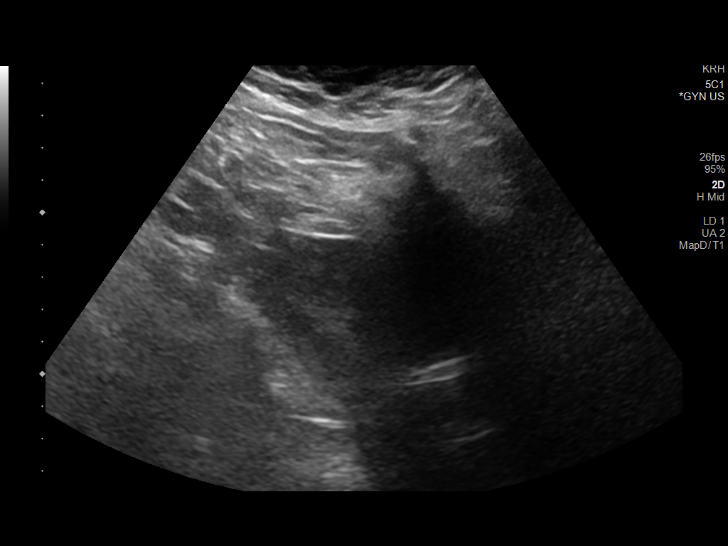
[im 3/35]
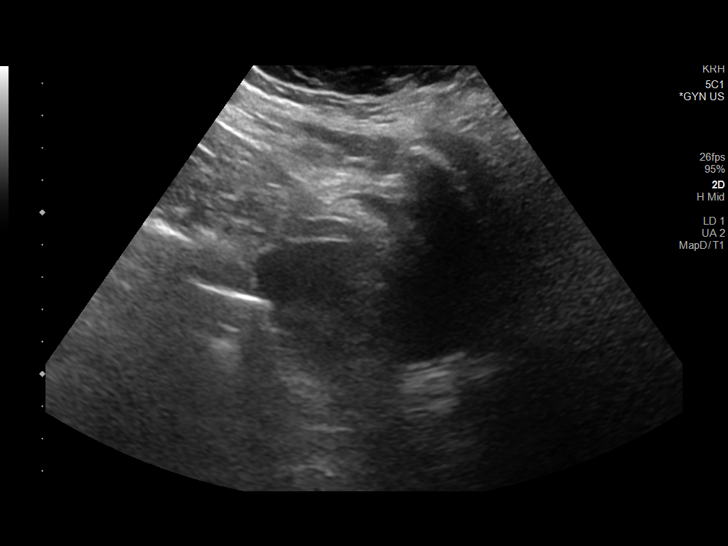
[im 6/35]
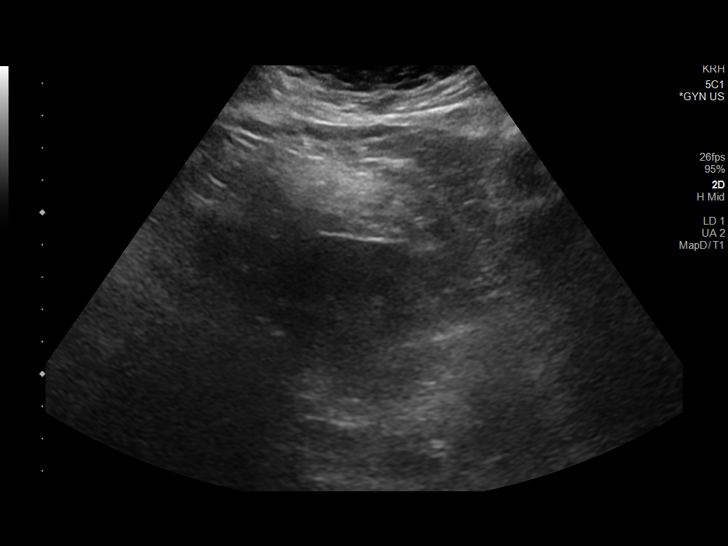
[im 9/35]
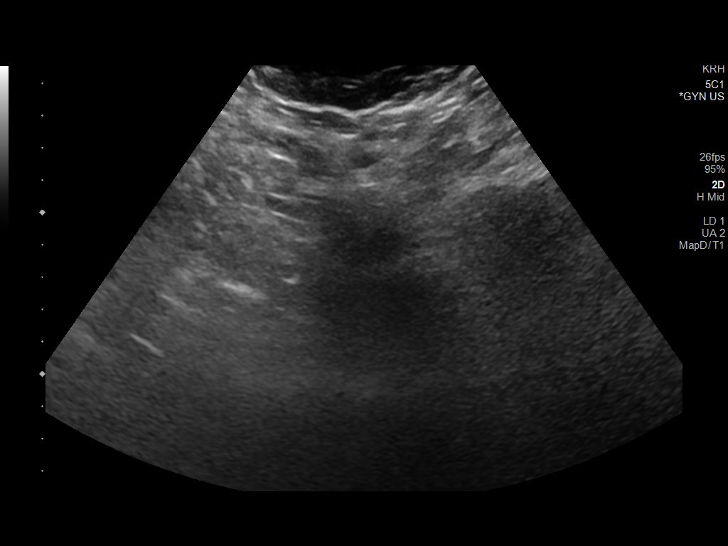
[im 12/35]
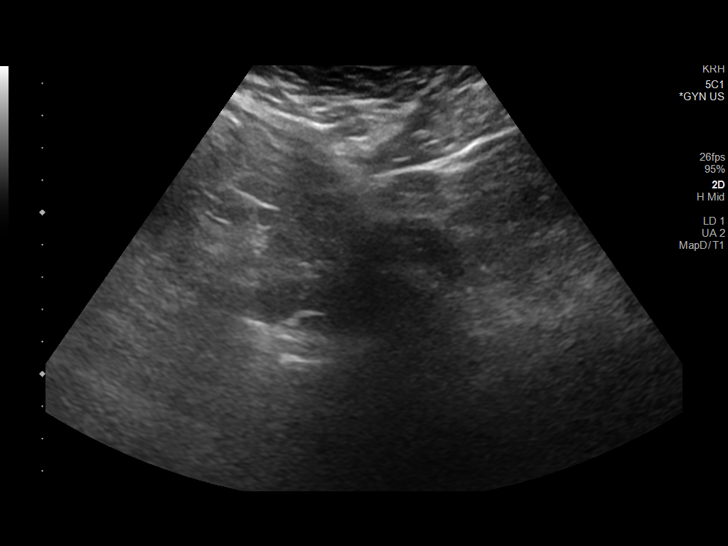
[im 15/35]
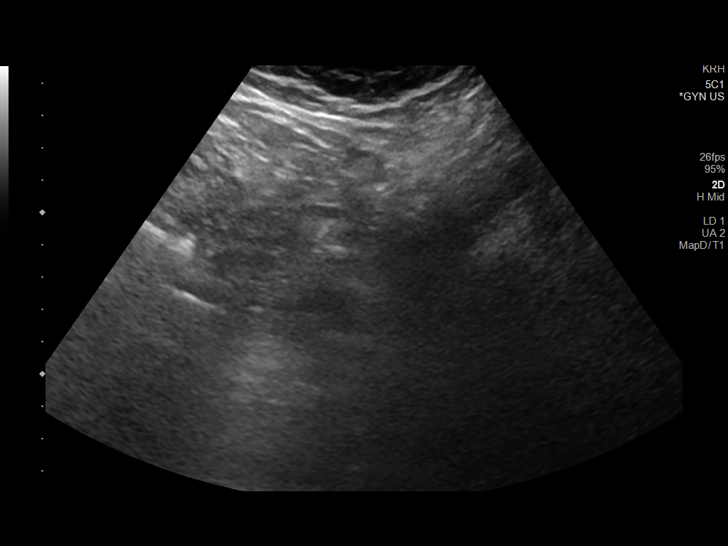
[im 18/35]
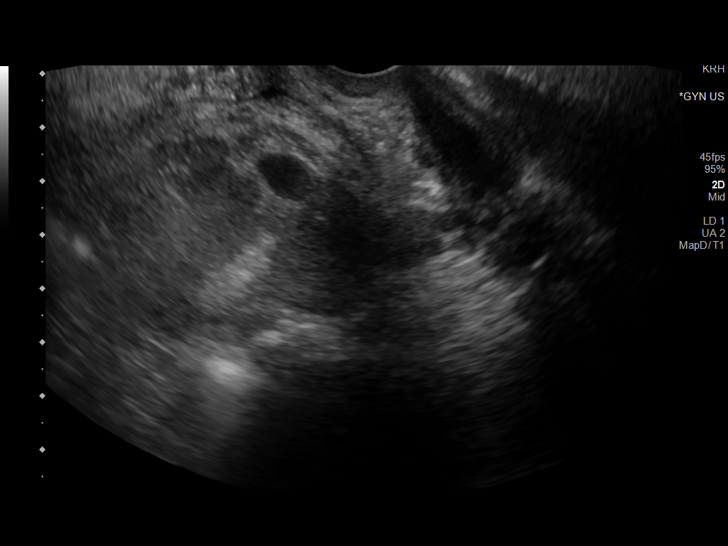
[im 20/35]
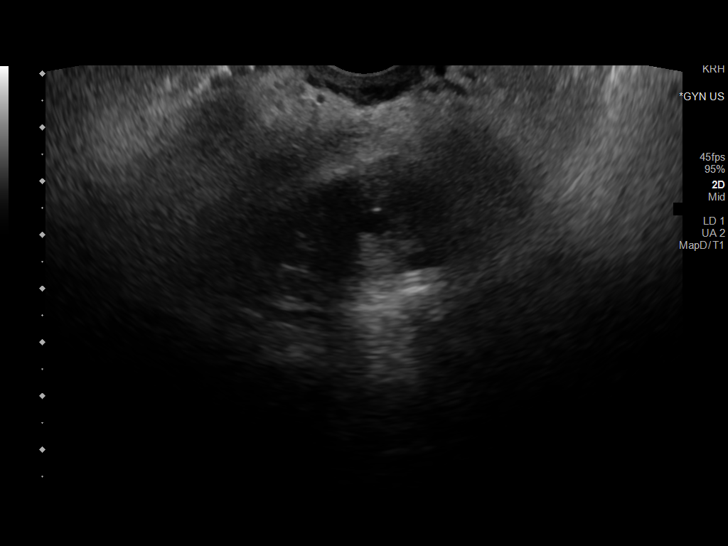
[im 23/35]
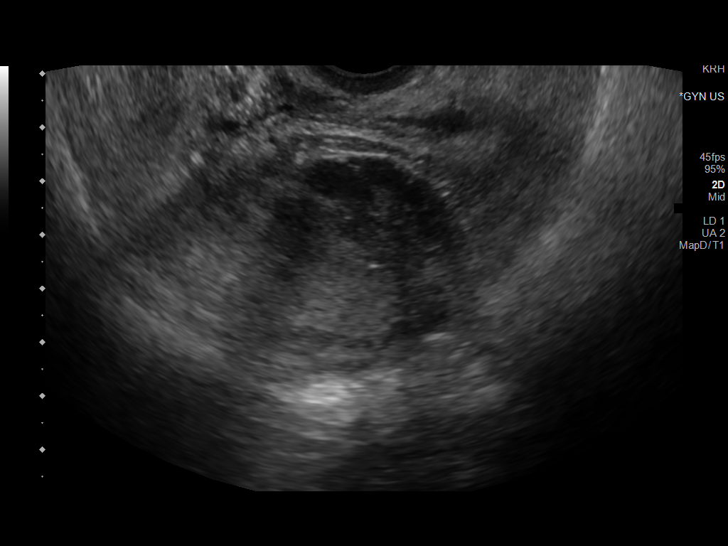
[im 26/35]
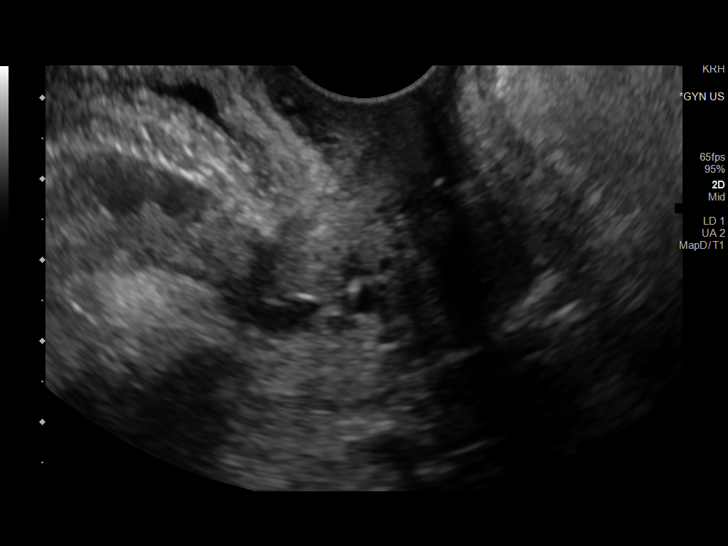
[im 29/35]
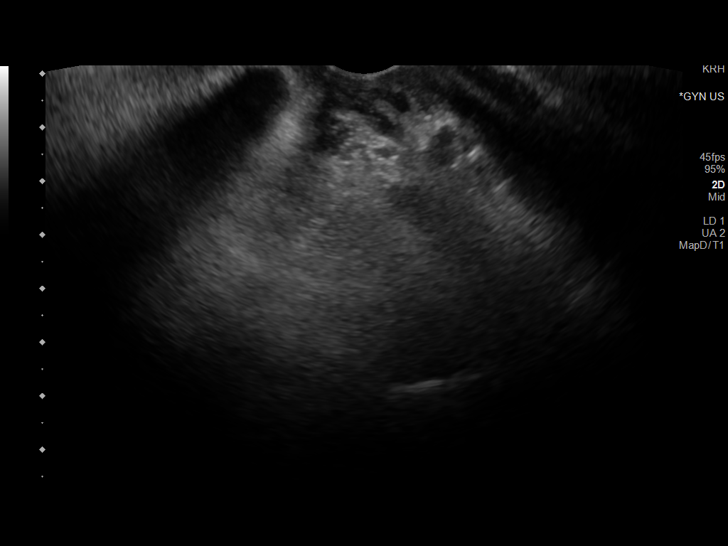
[im 32/35]
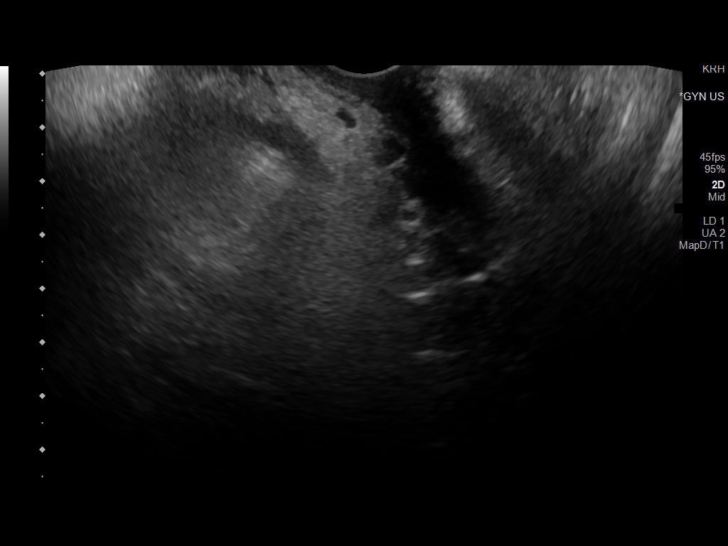
[im 35/35]
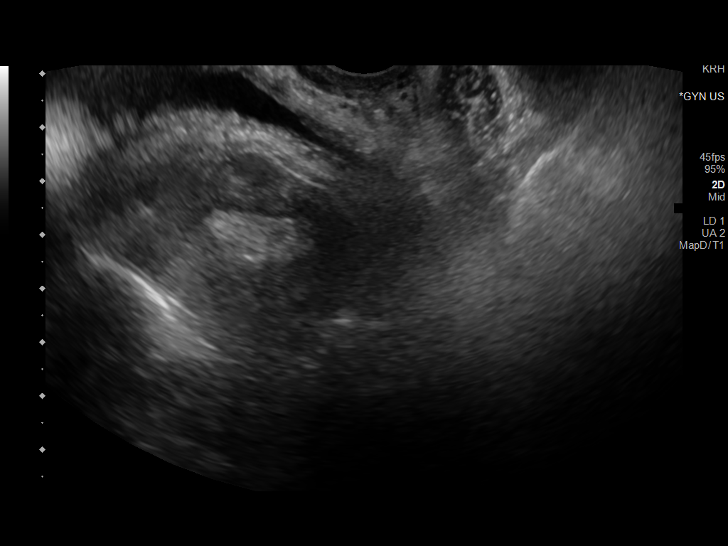

[13 of 25 positions shown; findings below may reference images not displayed]

FINDINGS: Uterus

Measurements: Approximately 7.6 x 3.8 x 5.0 cm = volume: 75.2 mL.
Dilated parametrial vessels. Nabothian cysts on the cervix.

Endometrium

Thickness: Approximately 17 mm.  No endometrial fluid.

Right ovary

Not visualized either transabdominally or transvaginally.

Left ovary

Not visualized either transabdominally or transvaginally.

Other findings

No abnormal free fluid.
IMPRESSION: 1. Abnormally thickened endometrium measuring up to 17 mm. In the
setting of post-menopausal bleeding, endometrial sampling is
indicated to exclude carcinoma. If results are benign,
sonohysterogram should be considered for focal lesion work-up. (Ref:
Radiological Reasoning: Algorithmic Workup of Abnormal Vaginal
Bleeding with Endovaginal Sonography and Sonohysterography. AJR
[id number]; 191:[id number])
2. Nonvisualization of the ovaries.
3. Dilated parametrial vessels, which can be seen in patients with
pelvic congestion syndrome.

## 2022-01-03 DIAGNOSIS — L80 Vitiligo: Secondary | ICD-10-CM | POA: Diagnosis not present

## 2022-01-03 NOTE — Progress Notes (Signed)
Cardiology Office Note:   Date:  01/06/2022  NAME:  Tiffany Gilmore    MRN: [id number] DOB:  05-04-1945   PCP:  Merrilee Seashore, MD  Cardiologist:  None  Electrophysiologist:  None   Referring MD: Merrilee Seashore, MD   Chief Complaint  Patient presents with   Follow-up        History of Present Illness:   Tiffany Gilmore is a 76 y.o. female with a hx of hypertension, OSA who presents for follow-up.  She reports she is doing well.  Denies chest pain or trouble breathing.  She is not exercising.  Still working as an Optometrist.  She is using her CPAP machine.  Coronary CTA was normal last year.  Her blood pressure is bit elevated.  She tells me she did take her medication.  We discussed increasing her medication versus checking her blood pressure daily at home.  She would like to do a conservative approach.  We discussed salt reductive strategies as well as regular exercise.  She will work on all of this.  CV exam unremarkable.  Overall doing well.  Problem List 1. Endometrial CA -s/p total hysterectomy and bilateral salpingoophorectomy 11/18/2019 2. HTN 3. Obesity 4. SVT ablation 5.  Hyperlipidemia -HDL 76, LDL 184, triglycerides 56 -CAC score 0; normal coronary arteries (CCTA: 12/2019) 6. Moderate OSA  Past Medical History: Past Medical History:  Diagnosis Date   Arthritis    Cancer ([place])    breast R   Cataract    Endometrial cancer (HCC)    GERD (gastroesophageal reflux disease)    Hyperlipidemia    Hypertension    SVT (supraventricular tachycardia)    Ablation in 2002 to treat    Past Surgical History: Past Surgical History:  Procedure Laterality Date   ABLATION     Cardiac ablation   BREAST SURGERY     CATARACT EXTRACTION W/ INTRAOCULAR LENS IMPLANT     CESAREAN SECTION     EYE SURGERY     ROBOTIC ASSISTED LAPAROSCOPIC LYSIS OF ADHESION N/A 11/18/2019   Procedure: XI ROBOTIC ASSISTED LAPAROSCOPIC LYSIS OF ADHESION;  Surgeon: Everitt Amber, MD;  Location: [facility];  Service: Gynecology;  Laterality: N/A;   ROBOTIC ASSISTED TOTAL HYSTERECTOMY WITH BILATERAL SALPINGO OOPHERECTOMY Bilateral 11/18/2019   Procedure: XI ROBOTIC ASSISTED TOTAL HYSTERECTOMY WITH BILATERAL SALPINGO OOPHORECTOMY;  Surgeon: Everitt Amber, MD;  Location: [facility];  Service: Gynecology;  Laterality: Bilateral;   SENTINEL NODE BIOPSY N/A 11/18/2019   Procedure: SENTINEL NODE BIOPSY;  Surgeon: Everitt Amber, MD;  Location: [facility];  Service: Gynecology;  Laterality: N/A;    Current Medications: Current Meds  Medication Sig   fish oil-omega-3 fatty acids 1000 MG capsule Take 2 g by mouth daily.    losartan (COZAAR) 50 MG tablet TAKE 1 TABLET(50 MG) BY MOUTH DAILY     Allergies:    Aspirin, Penicillin g, Sulfa antibiotics, and Sulfamethoxazole-trimethoprim   Social History: Social History   Socioeconomic History   Marital status: Married    Spouse name: Not on file   Number of children: 2   Years of education: Not on file   Highest education level: Not on file  Occupational History   Occupation: working as Optometrist  Tobacco Use   Smoking status: Never   Smokeless tobacco: Never  Scientific laboratory technician Use: Never used  Substance and Sexual Activity   Alcohol use: No   Drug use: No   Sexual activity: Yes  Other Topics Concern  Not on file  Social History Narrative   Not on file   Social Determinants of Health   Financial Resource Strain: Not on file  Food Insecurity: Not on file  Transportation Needs: Not on file  Physical Activity: Not on file  Stress: Not on file  Social Connections: Not on file     Family History: The patient's family history includes Heart disease in her brother.  ROS:   All other ROS reviewed and negative. Pertinent positives noted in the HPI.     EKGs/Labs/Other Studies Reviewed:   The following studies were personally reviewed by me today:   Recent Labs: No results found for requested labs within last 365 days.   Recent Lipid  Panel No results found for: "CHOL", "TRIG", "HDL", "CHOLHDL", "VLDL", "LDLCALC", "LDLDIRECT"  Physical Exam:   VS:  BP (!) 150/72   Pulse 87   Ht '4\' 11"'$  (1.499 m)   Wt 207 lb (93.9 kg)   SpO2 98%   BMI 41.81 kg/m    Wt Readings from Last 3 Encounters:  01/06/22 207 lb (93.9 kg)  01/13/21 210 lb 9.6 oz (95.5 kg)  01/05/21 209 lb (94.8 kg)    General: Well nourished, well developed, in no acute distress Head: Atraumatic, normal size  Eyes: PEERLA, EOMI  Neck: Supple, no JVD Endocrine: No thryomegaly Cardiac: Normal S1, S2; RRR; no murmurs, rubs, or gallops Lungs: Clear to auscultation bilaterally, no wheezing, rhonchi or rales  Abd: Soft, nontender, no hepatomegaly  Ext: No edema, pulses 2+ Musculoskeletal: No deformities, BUE and BLE strength normal and equal Skin: Warm and dry, no rashes   Neuro: Alert and oriented to person, place, time, and situation, CNII-XII grossly intact, no focal deficits  Psych: Normal mood and affect   ASSESSMENT:   Tiffany Gilmore is a 76 y.o. female who presents for the following: 1. OSA (obstructive sleep apnea)   2. Primary hypertension   3. Obesity, morbid, BMI 40.0-49.9 (HCC)     PLAN:   1. OSA (obstructive sleep apnea) 2. Primary hypertension 3. Obesity, morbid, BMI 40.0-49.9 ([place]) -Denies chest pain or trouble breathing.  Coronary CTA normal last year.  We have continued with preventive approach.  Blood pressure is elevated today.  We discussed increasing her medications versus checking blood pressure at home.  She would like to try conservative approach.  She will work on salt reduction as well as exercise.  We also discussed weight loss.  She is compliant with her CPAP machine.  We will have her come back to the office in 3 months for repeat blood pressure check with the physician assistant.  She will see [place] yearly.  Disposition: Return in about 3 months (around 04/07/2022).  Medication Adjustments/Labs and Tests Ordered: Current medicines  are reviewed at length with the patient today.  Concerns regarding medicines are outlined above.  No orders of the defined types were placed in this encounter.  No orders of the defined types were placed in this encounter.   Patient Instructions  Medication Instructions:  The current medical regimen is effective;  continue present plan and medications.  *If you need a refill on your cardiac medications before your next appointment, please call your pharmacy*   Follow-Up: At [facility], you and your health needs are our priority.  As part of our continuing mission to provide you with exceptional heart care, we have created designated Provider Care Teams.  These Care Teams include your primary Cardiologist (physician) and Advanced Practice  Providers (APPs -  Physician Assistants and Nurse Practitioners) who all work together to provide you with the care you need, when you need it.  We recommend signing up for the patient portal called "MyChart".  Sign up information is provided on this After Visit Summary.  MyChart is used to connect with patients for Virtual Visits (Telemedicine).  Patients are able to view lab/test results, encounter notes, upcoming appointments, etc.  Non-urgent messages can be sent to your provider as well.   To learn more about what you can do with MyChart, go to [email or web address].    Your next appointment:   3 month(s)  The format for your next appointment:   In Person  Provider:   Sande Rives, PA-C, Almyra Deforest, PA-C, or Diona Browner, NP    Then, Eleonore Chiquito, MD will plan to see you again in 12 month(s).    Check BP at home. Use BP log provided.         Time Spent with Patient: I have spent a total of 25 minutes with patient reviewing hospital notes, telemetry, EKGs, labs and examining the patient as well as establishing an assessment and plan that was discussed with the patient.  > 50% of time was spent in direct patient  care.  Signed, Addison Naegeli. Audie Box, MD, [place]  [address], [place], [place] [address] [phone]  01/06/2022 2:30 PM

## 2022-01-06 ENCOUNTER — Encounter: Payer: Self-pay | Admitting: Cardiovascular Disease

## 2022-01-06 ENCOUNTER — Ambulatory Visit: Payer: Medicare Other | Attending: Cardiovascular Disease | Admitting: Cardiovascular Disease

## 2022-01-06 VITALS — BP 150/72 | HR 87 | Ht 59.0 in | Wt 207.0 lb

## 2022-01-06 DIAGNOSIS — G4733 Obstructive sleep apnea (adult) (pediatric): Secondary | ICD-10-CM

## 2022-01-06 DIAGNOSIS — I10 Essential (primary) hypertension: Secondary | ICD-10-CM | POA: Insufficient documentation

## 2022-01-06 NOTE — Patient Instructions (Addendum)
Medication Instructions:  The current medical regimen is effective;  continue present plan and medications.  *If you need a refill on your cardiac medications before your next appointment, please call your pharmacy*   Follow-Up: At [facility], you and your health needs are our priority.  As part of our continuing mission to provide you with exceptional heart care, we have created designated Provider Care Teams.  These Care Teams include your primary Cardiologist (physician) and Advanced Practice Providers (APPs -  Physician Assistants and Nurse Practitioners) who all work together to provide you with the care you need, when you need it.  We recommend signing up for the patient portal called "MyChart".  Sign up information is provided on this After Visit Summary.  MyChart is used to connect with patients for Virtual Visits (Telemedicine).  Patients are able to view lab/test results, encounter notes, upcoming appointments, etc.  Non-urgent messages can be sent to your provider as well.   To learn more about what you can do with MyChart, go to [email or web address].    Your next appointment:   3 month(s)  The format for your next appointment:   In Person  Provider:   Sande Rives, PA-C, Almyra Deforest, PA-C, or Diona Browner, NP    Then, Eleonore Chiquito, MD will plan to see you again in 12 month(s).    Check BP at home. Use BP log provided.

## 2022-01-31 ENCOUNTER — Telehealth: Payer: Self-pay | Admitting: Cardiovascular Disease

## 2022-01-31 IMAGING — CR DG CHEST 2V
2 series · 2 of 2 positions shown · non-contrast
Comparison: 01/15/2013

CLINICAL DATA: LEFT sided chest pain

EXAM:
CHEST - 2 VIEW

[w chest pa]
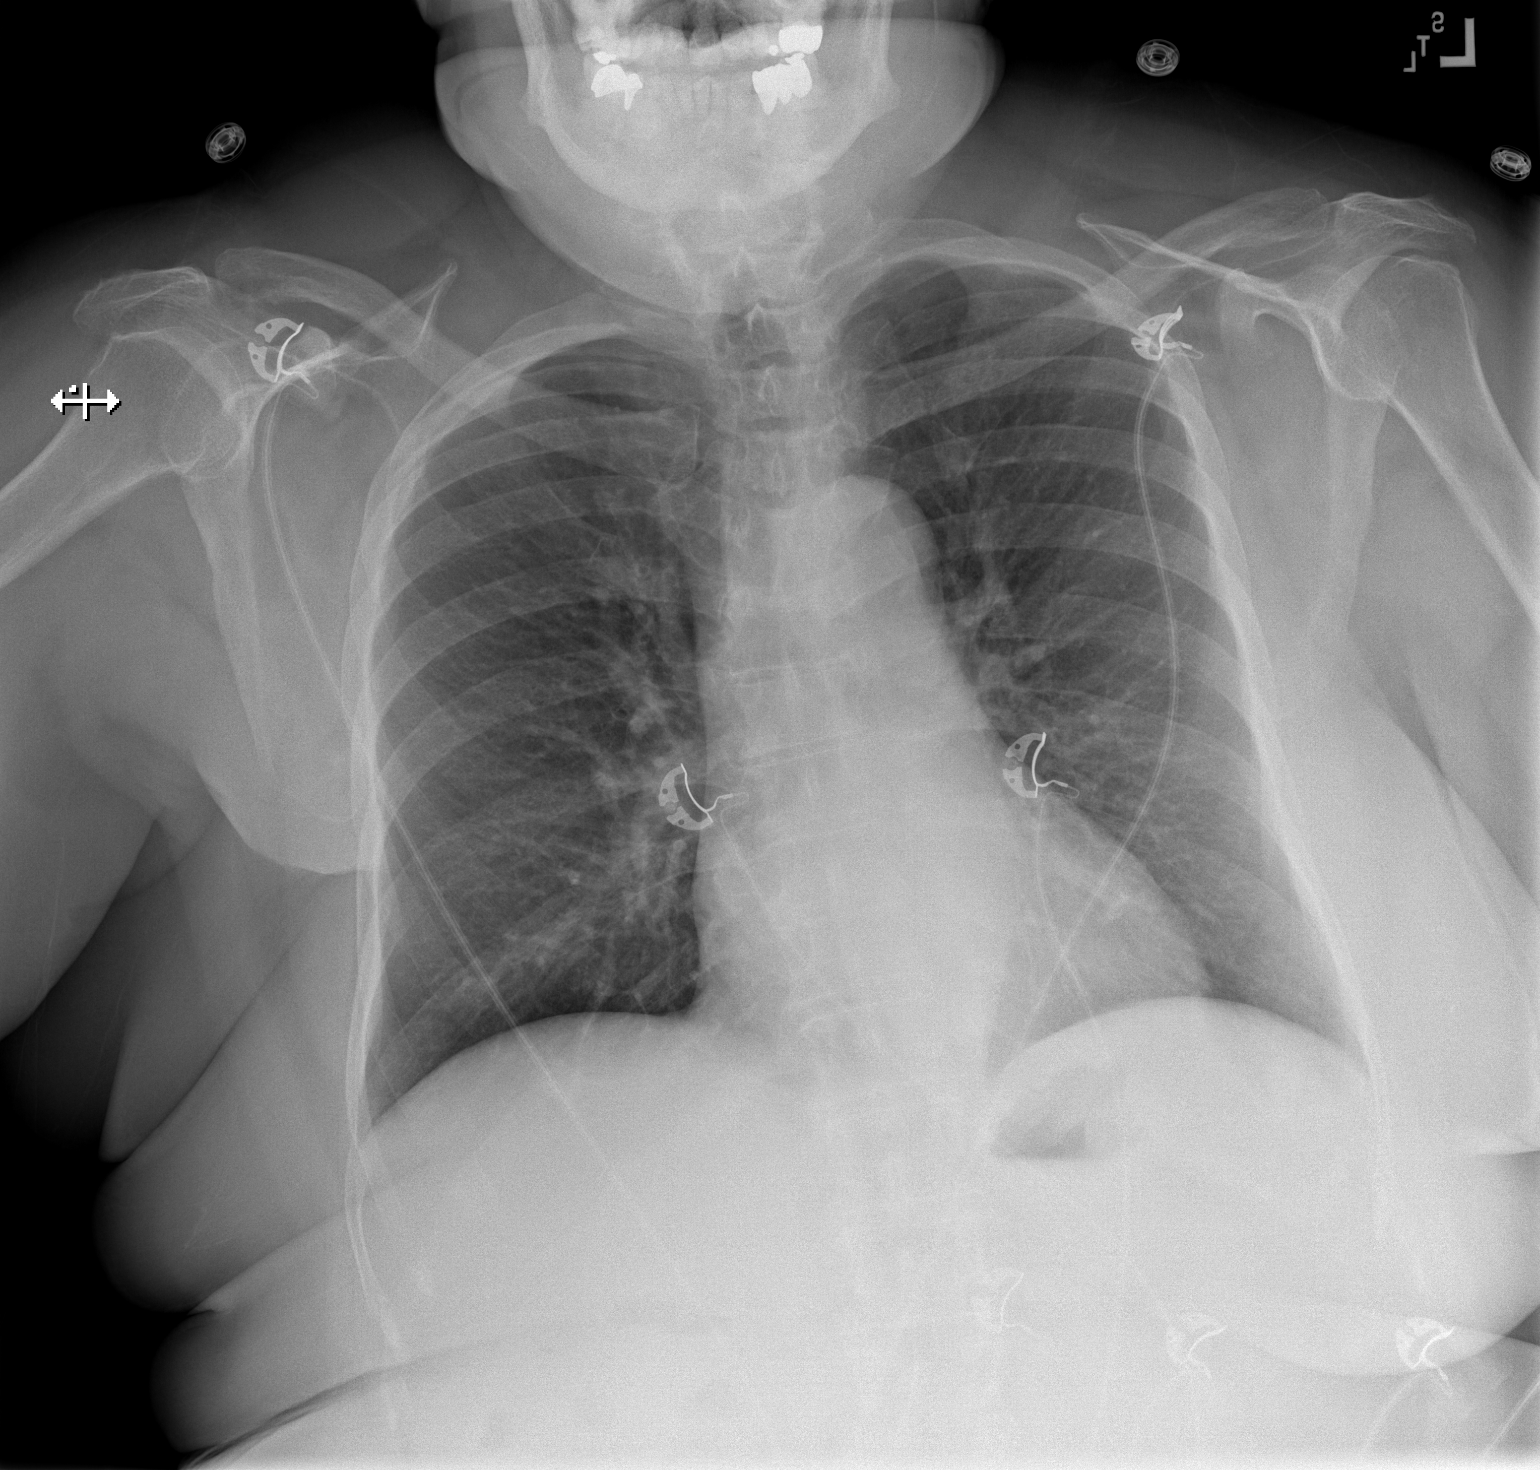

[w chest lat]
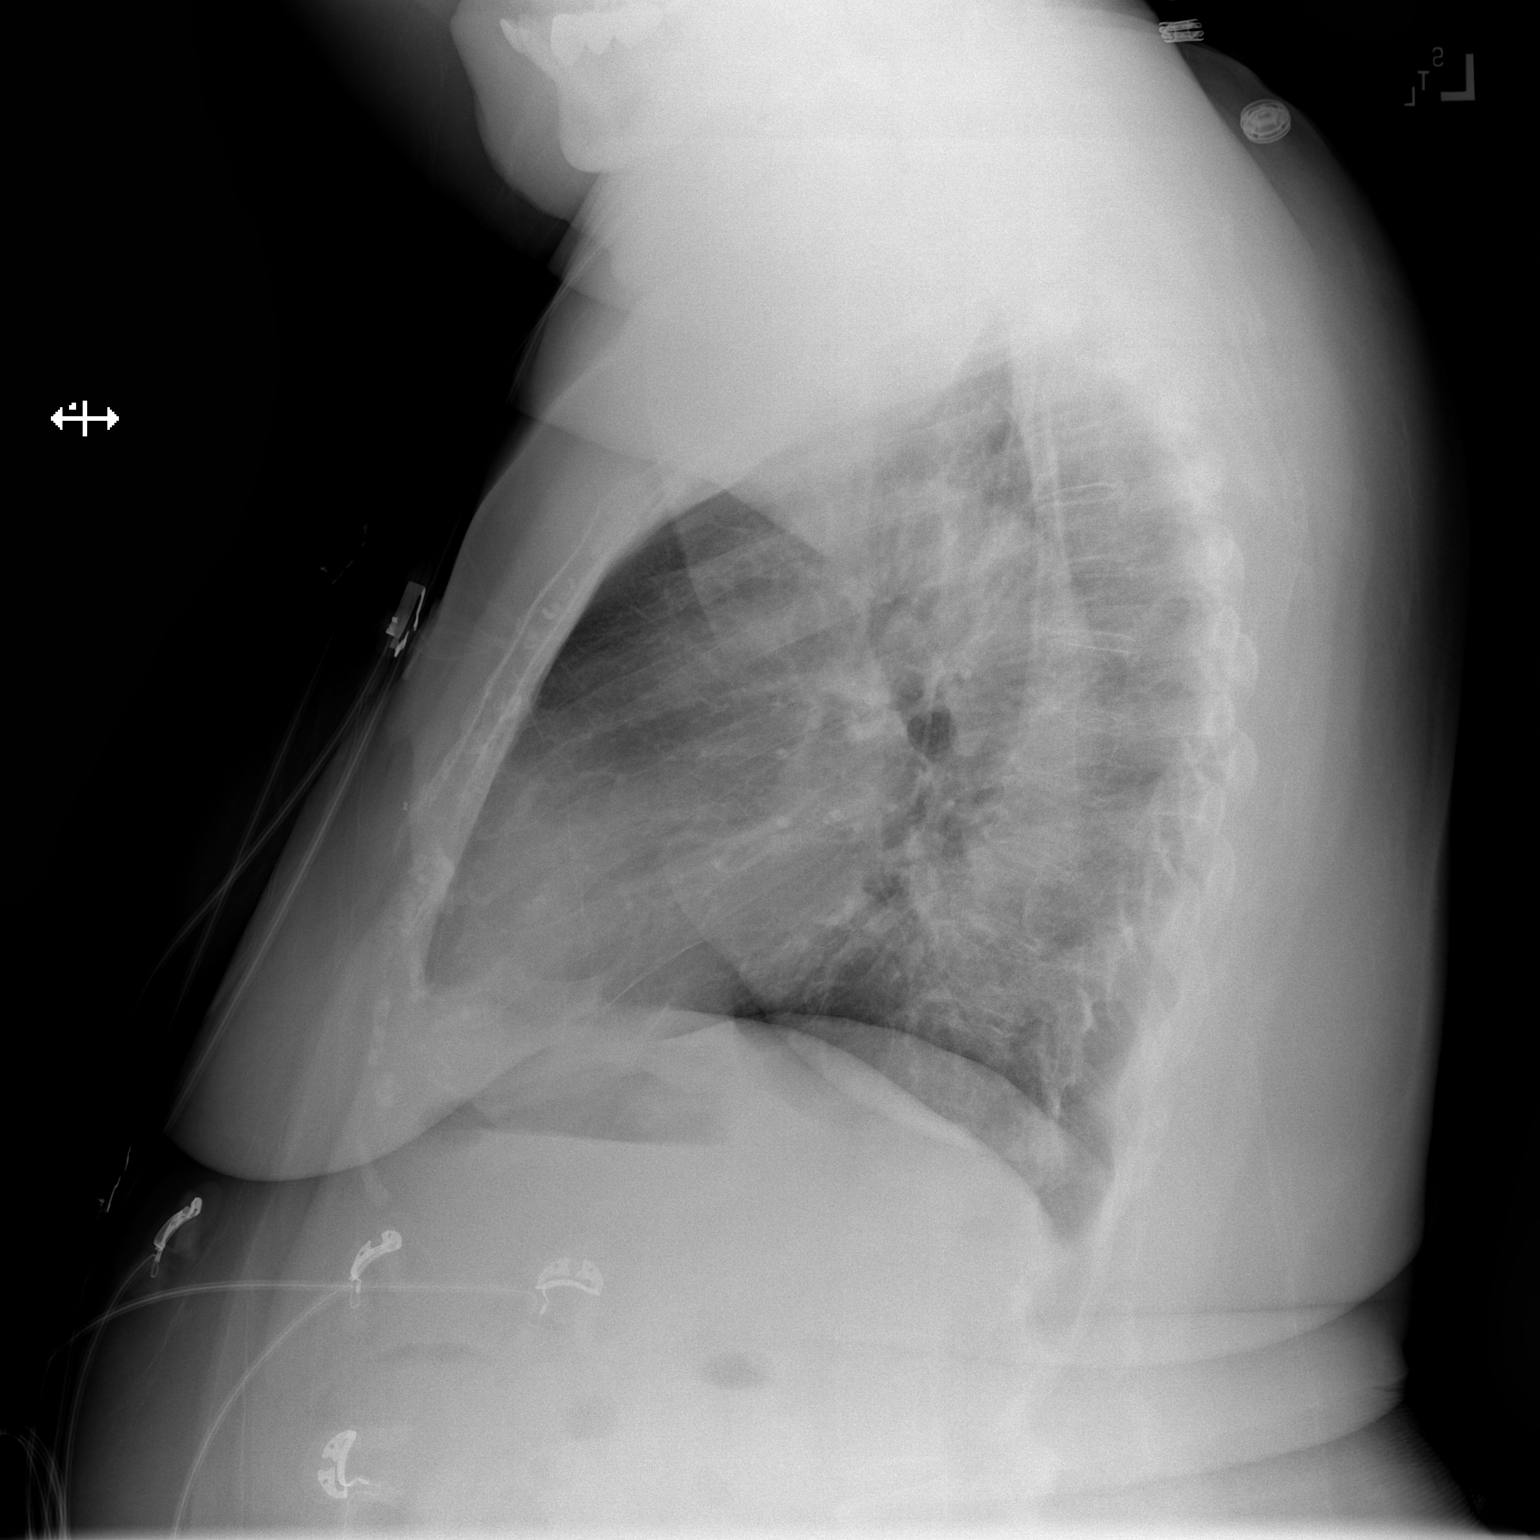

[2 of 2 positions shown; findings below may reference images not displayed]

FINDINGS: Trachea is midline. Cardiomediastinal contours and hilar structures
are normal.

Lungs are clear. No sign of pleural effusion. On limited assessment
no acute skeletal process.
IMPRESSION: No acute cardiopulmonary disease.

## 2022-01-31 NOTE — Telephone Encounter (Signed)
Called patient, advised of message below. Patient verbalized understanding.  

## 2022-01-31 NOTE — Telephone Encounter (Signed)
Patient will need to refer to the letter she received from [place]. [facility] is processing all transfers to new DME companies.

## 2022-01-31 NOTE — Telephone Encounter (Signed)
  Patient is calling because the place she was told to get her cpap supplies no longer provides services for that and she needs to know where else she can go to get her supplies.

## 2022-02-09 DIAGNOSIS — I10 Essential (primary) hypertension: Secondary | ICD-10-CM | POA: Diagnosis not present

## 2022-02-09 DIAGNOSIS — Z5320 Procedure and treatment not carried out because of patient's decision for unspecified reasons: Secondary | ICD-10-CM | POA: Diagnosis not present

## 2022-02-09 DIAGNOSIS — E782 Mixed hyperlipidemia: Secondary | ICD-10-CM | POA: Diagnosis not present

## 2022-02-09 DIAGNOSIS — L409 Psoriasis, unspecified: Secondary | ICD-10-CM | POA: Diagnosis not present

## 2022-02-09 DIAGNOSIS — D72819 Decreased white blood cell count, unspecified: Secondary | ICD-10-CM | POA: Diagnosis not present

## 2022-02-16 DIAGNOSIS — I10 Essential (primary) hypertension: Secondary | ICD-10-CM | POA: Diagnosis not present

## 2022-02-16 DIAGNOSIS — L409 Psoriasis, unspecified: Secondary | ICD-10-CM | POA: Diagnosis not present

## 2022-02-16 DIAGNOSIS — E782 Mixed hyperlipidemia: Secondary | ICD-10-CM | POA: Diagnosis not present

## 2022-02-16 DIAGNOSIS — Z5320 Procedure and treatment not carried out because of patient's decision for unspecified reasons: Secondary | ICD-10-CM | POA: Diagnosis not present

## 2022-02-16 DIAGNOSIS — D72819 Decreased white blood cell count, unspecified: Secondary | ICD-10-CM | POA: Diagnosis not present

## 2022-02-27 ENCOUNTER — Telehealth: Payer: Self-pay | Admitting: Cardiovascular Disease

## 2022-02-27 NOTE — Telephone Encounter (Signed)
Patient states she needs her sleep apnea prescription sent to a new company.She says [facility] is not doing it and requests her prescription be sent to [facility] fax: [phone] Attention Kenney Houseman

## 2022-02-27 NOTE — Telephone Encounter (Signed)
CPAP supply order and records faxed to [facility]: [facility] per patient request.

## 2022-04-04 ENCOUNTER — Encounter: Payer: Self-pay | Admitting: Nurse Practitioner

## 2022-04-04 ENCOUNTER — Ambulatory Visit: Payer: Medicare Other | Attending: Nurse Practitioner | Admitting: Nurse Practitioner

## 2022-04-04 VITALS — BP 136/62 | HR 81 | Ht 59.0 in | Wt 204.0 lb

## 2022-04-04 DIAGNOSIS — I10 Essential (primary) hypertension: Secondary | ICD-10-CM

## 2022-04-04 DIAGNOSIS — E782 Mixed hyperlipidemia: Secondary | ICD-10-CM | POA: Diagnosis not present

## 2022-04-04 DIAGNOSIS — I471 Supraventricular tachycardia, unspecified: Secondary | ICD-10-CM

## 2022-04-04 DIAGNOSIS — G4733 Obstructive sleep apnea (adult) (pediatric): Secondary | ICD-10-CM

## 2022-04-04 NOTE — Progress Notes (Signed)
Office Visit    Patient Name: Tiffany Gilmore Date of Encounter: 04/04/2022  Primary Care Provider:  Merrilee Seashore, MD Primary Cardiologist:  Evalina Field, MD  Chief Complaint    77 year old female with a history of SVT s/p ablation, hypertension, hyperlipidemia, OSA, obesity, and endometrial cancer s/p total hysterectomy and bilateral salpingoophorectomy who presents for follow-up related to SVT, hypertension and OSA.  Past Medical History    Past Medical History:  Diagnosis Date   Arthritis    Cancer ([place])    breast R   Cataract    Endometrial cancer ([place])    GERD (gastroesophageal reflux disease)    Hyperlipidemia    Hypertension    SVT (supraventricular tachycardia)    Ablation in 2002 to treat   Past Surgical History:  Procedure Laterality Date   ABLATION     Cardiac ablation   BREAST SURGERY     CATARACT EXTRACTION W/ INTRAOCULAR LENS IMPLANT     CESAREAN SECTION     EYE SURGERY     ROBOTIC ASSISTED LAPAROSCOPIC LYSIS OF ADHESION N/A 11/18/2019   Procedure: XI ROBOTIC ASSISTED LAPAROSCOPIC LYSIS OF ADHESION;  Surgeon: Everitt Amber, MD;  Location: [facility];  Service: Gynecology;  Laterality: N/A;   ROBOTIC ASSISTED TOTAL HYSTERECTOMY WITH BILATERAL SALPINGO OOPHERECTOMY Bilateral 11/18/2019   Procedure: XI ROBOTIC ASSISTED TOTAL HYSTERECTOMY WITH BILATERAL SALPINGO OOPHORECTOMY;  Surgeon: Everitt Amber, MD;  Location: [facility];  Service: Gynecology;  Laterality: Bilateral;   SENTINEL NODE BIOPSY N/A 11/18/2019   Procedure: SENTINEL NODE BIOPSY;  Surgeon: Everitt Amber, MD;  [facility];  Service: Gynecology;  Laterality: N/A;    Allergies  Allergies  Allergen Reactions   Aspirin     REACTION: upsets stomach at times   Penicillin G Other (See Comments)   Sulfa Antibiotics Rash   Sulfamethoxazole-Trimethoprim Rash     Labs/Other Studies Reviewed    The following studies were reviewed today: CCTA 12/2019:  IMPRESSION: 1. Coronary calcium score of  0.   2. Normal coronary origin with left dominance.   3. Normal coronary arteries.   4. Small PFO.   5. Mildly dilated pulmonary artery.   RECOMMENDATIONS: 1. No evidence of CAD (0%). Consider non-atherosclerotic causes of chest pain.    Recent Labs: No results found for requested labs within last 365 days.  Recent Lipid Panel No results found for: "CHOL", "TRIG", "HDL", "CHOLHDL", "VLDL", "LDLCALC", "LDLDIRECT"  History of Present Illness    77 year old female with the above past medical history including SVT s/p ablation, hypertension, hyperlipidemia, OSA, obesity, and endometrial cancer s/p total hysterectomy and bilateral salpingoophorectomy.  She has a history of SVT s/p ablation in 2002.  Coronary CTA in 12/2019 showed coronary calcium score of 0, no evidence of CAD.  She was last seen in the office on 01/06/2022 and was stable from a cardiac standpoint.  BP was mildly elevated.  She declined titration of medication at this time and preferred to focus on lifestyle modifications with diet and exercise.  She presents today for follow-up. Since her last visit been stable from a cardiac standpoint.  SBP has been in the 130s to 140s the past 2 days she has noted episodes of her heart racing, these lasted less than 15 minutes and occurred at night when she was resting following a meal.  She denied any associated symptoms.  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  Other than her recent palpitations, she denies any additional concerns today.  Home Medications    Current Outpatient Medications  Medication Sig Dispense Refill   fish oil-omega-3 fatty acids 1000 MG capsule Take 2 g by mouth daily.      losartan (COZAAR) 50 MG tablet TAKE 1 TABLET(50 MG) BY MOUTH DAILY 90 tablet 3   Multiple Vitamins-Minerals (MULTIVITAMIN ADULTS PO) Take 1 tablet by mouth  daily.     No current facility-administered medications for this visit.     Review of Systems    She denies chest pain, palpitations, dyspnea, pnd, orthopnea, n, v, dizziness, syncope, edema, weight gain, or early satiety. All other systems reviewed and are otherwise negative except as noted above.   Physical Exam    VS:  BP 136/62 (BP Location: Left Arm, Patient Position: Sitting, Cuff Size: Large)   Pulse 81   Ht '4\' 11"'$  (1.499 m)   Wt 204 lb (92.5 kg)   BMI 41.20 kg/m   GEN: Well nourished, well developed, in no acute distress. HEENT: normal. Neck: Supple, no JVD, carotid bruits, or masses. Cardiac: RRR, no murmurs, rubs, or gallops. No clubbing, cyanosis, edema.  Radials/DP/PT 2+ and equal bilaterally.  Respiratory:  Respirations regular and unlabored, clear to auscultation bilaterally. GI: Soft, nontender, nondistended, BS + x 4. MS: no deformity or atrophy. Skin: warm and dry, no rash. Neuro:  Strength and sensation are intact. Psych: Normal affect.  Accessory Clinical Findings    ECG personally reviewed by me today -NSR, 81 bpm- no acute changes.   Lab Results  Component Value Date   WBC 4.3 09/21/2020   HGB 13.6 09/21/2020   HCT 41.2 09/21/2020   MCV 86.2 09/21/2020   PLT 198 09/21/2020   Lab Results  Component Value Date   CREATININE 0.79 09/21/2020   BUN 9 09/21/2020   NA 140 09/21/2020   K 4.1 09/21/2020   CL 107 09/21/2020   CO2 26 09/21/2020   Lab Results  Component Value Date   ALT 19 09/21/2020   AST 28 09/21/2020   ALKPHOS 76 09/21/2020   BILITOT 0.7 09/21/2020   No results found for: "CHOL", "HDL", "LDLCALC", "LDLDIRECT", "TRIG", "CHOLHDL"  No results found for: "HGBA1C"  Assessment & Plan    1. Hypertension: BP stable in office today, though she does note slightly higher readings with her home BP cuff.  Patient declines titration of BP medication at this time.  I advised her to continue to monitor BP and report BP consistently greater than  140/80.  Encouraged ongoing lifestyle modifications with diet and exercise.  Continue losartan.  2. SVT: H/o prior ablation. Over the past 2 nights she has had brief episodes of feeling that her heart is racing, similar to prior SVT.  She does report an increased amount of stress recently. I advised her to notify us if her symptoms become more frequent or persistent.  Discussed ED precautions, vagal maneuvers.  Continue to monitor.  3. Hyperlipidemia: Most recent lipid profile not available for review.  Monitored and managed per PCP.  She notes that her LDL is elevated above goal. She is unwilling to initiate lipid-lowering therapy at this time.  Continue to work on lifestyle modifications with diet and exercise.  4. OSA: Adherent to CPAP.  No concerns.  5. Obesity: Encouraged increase activity as tolerated.  6. Disposition: Follow-up in 6 months with Dr. Audie Box, sooner if needed.  Lenna Sciara, NP 04/04/2022, 12:02 PM

## 2022-04-04 NOTE — Patient Instructions (Addendum)
Medication Instructions:  Your physician recommends that you continue on your current medications as directed. Please refer to the Current Medication list given to you today.  *If you need a refill on your cardiac medications before your next appointment, please call your pharmacy*   Lab Work: NONE ordered at this time of appointment   If you have labs (blood work) drawn today and your tests are completely normal, you will receive your results only by: [place] (if you have MyChart) OR A paper copy in the mail If you have any lab test that is abnormal or we need to change your treatment, we will call you to review the results.   Testing/Procedures: NONE ordered at this time of appointment     Follow-Up: At [facility], you and your health needs are our priority.  As part of our continuing mission to provide you with exceptional heart care, we have created designated Provider Care Teams.  These Care Teams include your primary Cardiologist (physician) and Advanced Practice Providers (APPs -  Physician Assistants and Nurse Practitioners) who all work together to provide you with the care you need, when you need it.  We recommend signing up for the patient portal called "MyChart".  Sign up information is provided on this After Visit Summary.  MyChart is used to connect with patients for Virtual Visits (Telemedicine).  Patients are able to view lab/test results, encounter notes, upcoming appointments, etc.  Non-urgent messages can be sent to your provider as well.   To learn more about what you can do with MyChart, go to [email or web address].    Your next appointment:   6 month(s)  Provider:   Evalina Field, MD     Other Instructions Monitor blood pressure. Report blood pressure consistently greater than 140/80. Please contact our office for increased palpitations.

## 2022-04-17 ENCOUNTER — Telehealth: Payer: Self-pay | Admitting: Nurse Practitioner

## 2022-04-17 NOTE — Telephone Encounter (Signed)
Pt would like a callback regarding her BP readings as requested per her last office visit on 3/5. Please advise.

## 2022-04-17 NOTE — Telephone Encounter (Signed)
Spoke with pt. Pt is aware that she is going to hold Losartan at this time and monitor BP. Pt is to report BP consistently greater than 140/80.

## 2022-04-17 NOTE — Telephone Encounter (Signed)
Returned call to patient, patient reports she had previously been taking her BP medications prior to checking her BP.  She did this one morning after her appointment and started feeling poorly soon after (lightheadedness and "buzzing" in her head), reports checking BP and it was 106/65.     Since this episode she has been checking BP prior to medication.   She reports most days her BP is 110s/60s and she does not take her medication.  She does report checking her BP later in the day to make sure this has not increased.   Reports BP on recheck was 116/68 (without medication).   She also reports using her CPAP nightly now.    She reports taking medication today (Losartan 50 mg) as her BP this morning was 0000000 systolic.  She reports getting only 2.5 hours of sleep last night.   She wanted to update Raquel Sarna NP.

## 2022-06-19 ENCOUNTER — Other Ambulatory Visit: Payer: Self-pay | Admitting: Cardiovascular Disease

## 2022-06-21 DIAGNOSIS — Z961 Presence of intraocular lens: Secondary | ICD-10-CM | POA: Diagnosis not present

## 2022-08-23 DIAGNOSIS — M79645 Pain in left finger(s): Secondary | ICD-10-CM | POA: Diagnosis not present

## 2022-08-23 DIAGNOSIS — M778 Other enthesopathies, not elsewhere classified: Secondary | ICD-10-CM | POA: Diagnosis not present

## 2022-10-16 DIAGNOSIS — Z1231 Encounter for screening mammogram for malignant neoplasm of breast: Secondary | ICD-10-CM | POA: Diagnosis not present

## 2022-11-20 ENCOUNTER — Telehealth: Payer: Self-pay | Admitting: *Deleted

## 2022-11-20 NOTE — Telephone Encounter (Signed)
Spoke with Tiffany Gilmore who called the office to make a follow up appt. With Dr. Tamela Oddi. Pt last saw Dr. Tamela Oddi in December of 2022. According to her progress note, pt was to follow up every 6 months and her PCP annually. Pt states she saw her PCP in April of this year. Pt was given an appointment for Wednesday, November 13 th. At 1245. Pt agreed to date and time.

## 2022-12-13 ENCOUNTER — Encounter: Payer: Self-pay | Admitting: Obstetrics & Gynecology

## 2022-12-13 ENCOUNTER — Other Ambulatory Visit: Payer: Self-pay

## 2022-12-13 ENCOUNTER — Inpatient Hospital Stay: Payer: Medicare Other | Attending: Obstetrics & Gynecology | Admitting: Obstetrics & Gynecology

## 2022-12-13 VITALS — BP 160/84 | HR 93 | Temp 98.4°F | Resp 20 | Wt 197.9 lb

## 2022-12-13 DIAGNOSIS — C541 Malignant neoplasm of endometrium: Secondary | ICD-10-CM

## 2022-12-13 DIAGNOSIS — Z90722 Acquired absence of ovaries, bilateral: Secondary | ICD-10-CM | POA: Diagnosis not present

## 2022-12-13 DIAGNOSIS — Z8542 Personal history of malignant neoplasm of other parts of uterus: Secondary | ICD-10-CM | POA: Diagnosis not present

## 2022-12-13 DIAGNOSIS — Z90710 Acquired absence of both cervix and uterus: Secondary | ICD-10-CM | POA: Diagnosis not present

## 2022-12-13 NOTE — Assessment & Plan Note (Addendum)
H/O stage IA grade 1 endometrial cancer (MMRd, MLH1 hypermethylation present). S/p staging surgery in September, 2021. Negative symptom review, normal exam.  No evidence of recurrence     >continue to have follow-up every 6 months for 5 years in accordance with NCCN guidelines.

## 2022-12-13 NOTE — Progress Notes (Signed)
Follow Up Note: Gyn-Onc  Tiffany Gilmore 77 y.o. female  CC: She presents for a f/u visit   HPI: The oncology history was reviewed.  Interval History: She denies any vaginal bleeding, cough, lethargy or increasing abdominal girth.   Review of Systems  Review of Systems  Constitutional:  Negative for malaise/fatigue and weight loss.  Respiratory:  Negative for shortness of breath and wheezing.   Cardiovascular:  Negative for chest pain and leg swelling.  Gastrointestinal:  Negative for abdominal pain, blood in stool, constipation, nausea and vomiting.  Genitourinary:  Negative for dysuria, frequency, hematuria and urgency.  Musculoskeletal:  Negative for joint pain and myalgias.  Neurological:  Negative for weakness.  Psychiatric/Behavioral:  Negative for depression. The patient does not have insomnia.    Current medications, allergy, social history, past surgical history, past medical history, family history were all reviewed.    Vitals:  BP (!) 160/84 Comment: Manual recheck, MD notified. Pt no S&S, will monitor and f/u with PCP  Pulse 93   Temp 98.4 F (36.9 C) (Oral)   Resp 20   Wt 197 lb 14.4 oz (89.8 kg)   SpO2 100%   BMI 39.97 kg/m     Physical Exam Exam conducted with a chaperone present.  Constitutional:      General: She is not in acute distress. Cardiovascular:     Rate and Rhythm: Normal rate and regular rhythm.  Pulmonary:     Effort: Pulmonary effort is normal.     Breath sounds: Normal breath sounds. No wheezing or rhonchi.  Abdominal:     Palpations: Abdomen is soft.     Tenderness: There is no abdominal tenderness. There is no right CVA tenderness or left CVA tenderness.     Hernia: No hernia is present.  Genitourinary:    General: Normal vulva.     Urethra: No urethral lesion.     Vagina: No lesions. No bleeding. Foreshortened Musculoskeletal:     Cervical back: Neck supple.     Right lower leg: No edema.     Left lower leg: No edema.   Lymphadenopathy:     Upper Body:     Right upper body: No supraclavicular adenopathy.     Left upper body: No supraclavicular adenopathy.     Lower Body: No right inguinal adenopathy. No left inguinal adenopathy.  Skin:    Findings: No rash.  Neurological:     Mental Status: She is oriented to person, place, and time.   Assessment/Plan:  Endometrial adenocarcinoma (HCC) H/O stage IA grade 1 endometrial cancer (MMRd, MLH1 hypermethylation present). S/p staging surgery in September, 2021. Negative symptom review, normal exam.  No evidence of recurrence     >continue to have follow-up every 6 months for 5 years in accordance with NCCN guidelines.     I personally spent 25 minutes face-to-face and non-face-to-face in the care of this patient, which includes all pre, intra, and post visit time on the date of service.    Antionette Char, MD

## 2022-12-13 NOTE — Patient Instructions (Signed)
Return in 6 months

## 2022-12-21 DIAGNOSIS — Z5320 Procedure and treatment not carried out because of patient's decision for unspecified reasons: Secondary | ICD-10-CM | POA: Diagnosis not present

## 2022-12-21 DIAGNOSIS — E782 Mixed hyperlipidemia: Secondary | ICD-10-CM | POA: Diagnosis not present

## 2022-12-21 DIAGNOSIS — L409 Psoriasis, unspecified: Secondary | ICD-10-CM | POA: Diagnosis not present

## 2022-12-21 DIAGNOSIS — D72819 Decreased white blood cell count, unspecified: Secondary | ICD-10-CM | POA: Diagnosis not present

## 2022-12-21 DIAGNOSIS — I10 Essential (primary) hypertension: Secondary | ICD-10-CM | POA: Diagnosis not present

## 2023-01-01 DIAGNOSIS — Z0000 Encounter for general adult medical examination without abnormal findings: Secondary | ICD-10-CM | POA: Diagnosis not present

## 2023-01-01 DIAGNOSIS — L409 Psoriasis, unspecified: Secondary | ICD-10-CM | POA: Diagnosis not present

## 2023-01-01 DIAGNOSIS — I10 Essential (primary) hypertension: Secondary | ICD-10-CM | POA: Diagnosis not present

## 2023-01-01 DIAGNOSIS — E782 Mixed hyperlipidemia: Secondary | ICD-10-CM | POA: Diagnosis not present

## 2023-01-01 DIAGNOSIS — Z5320 Procedure and treatment not carried out because of patient's decision for unspecified reasons: Secondary | ICD-10-CM | POA: Diagnosis not present

## 2023-01-01 DIAGNOSIS — D72819 Decreased white blood cell count, unspecified: Secondary | ICD-10-CM | POA: Diagnosis not present

## 2023-03-07 ENCOUNTER — Telehealth: Payer: Self-pay | Admitting: Cardiovascular Disease

## 2023-03-07 NOTE — Telephone Encounter (Signed)
   Pt c/o of Chest Pain: STAT if active CP, including tightness, pressure, jaw pain, radiating pain to shoulder/upper arm/back, CP unrelieved by Nitro. Symptoms reported of SOB, nausea, vomiting, sweating.  1. Are you having CP right now? Yes, patient stated the feeling is like indigestion or if something was stuck in your chest and can't move.    2. Are you experiencing any other symptoms (ex. SOB, nausea, vomiting, sweating)? Dizziness and sweating (didn't last but a minute)    3. Is your CP continuous or coming and going? Continuous   4. Have you taken Nitroglycerin ? No   5. How long have you been experiencing CP? Started today   6. If NO CP at time of call then end call with telling Pt to call back or call 911 if Chest pain returns prior to return call from triage team.

## 2023-03-07 NOTE — Telephone Encounter (Signed)
 Called and spoke to patient. Verified name and DOB. Patient report she had an episode where she had a lump in here throat and she became dizzy. She deny any other symptoms at this time. She stated the episode lasted about 2-3 minutes. She said she's had the same feeling before after taking her Fish oil. Advised patient to monitor her symptoms. Advised her to call back or go to the ED if symptoms worsen or new symptoms develop.

## 2023-03-11 NOTE — Progress Notes (Signed)
Cardiology Office Note:  .   Date:  03/16/2023  ID:  Tiffany Gilmore, DOB 10-18-1945, MRN [id number] PCP: Georgianne Fick, MD  [facility] Providers Cardiologist:  Reatha Harps, MD  History of Present Illness: .   Tiffany Gilmore is a 78 y.o. female with a past medical history of SVT s/p ablation, HTN, HLD, OSA, obesity, endometrial cancer s/p total hysterectomy and bilateral salpingoophorectomy. She is followed by Dr. Flora Lipps and presents today for an annual follow up appointment.   Patient previously had a SVT ablation in 2002. Later underwent coronary CTA in 12/2019 that showed a coronary calcium score of 0, no evidence of CAD.   She was most recently seen by cardiology on 04/04/22. At that time, she was stable from a cardiac standpoint. She had episodes of palpitations, but the episodes were short. She was a bit stressed because it was tax season and she worked as an Airline pilot. Her BP was a bit elevated, but she declined titration of BP medications.   Today, patient comes in for evaluation about 2 weeks after having an episode of dizziness, feeling hot, and feeling like something is stuck in her throat. Reports that she had been standing at her sink washing dishes when she started to feel hot and sweaty. She went on to develop dizziness, and felt like there was something stuck in her throat. She took an aspirin, and symptoms went away after about 15 minutes. She has not had recurrence in symptoms in the 2 weeks since the episode. She has been able to do house work and walk up/down stairs without chest pain or shortness of breath. Denies dizziness, syncope, near syncope, palpitations. No symptoms similar to episodes of SVT that she had in the past.   ROS: Denies chest pain, DOE, orthopnea, ankle edema, palpitations. Had one episode of dizziness. No syncope or near syncope   Studies Reviewed: .   Cardiac Studies & Procedures    ______________________________________________________________________________________________          CT SCANS  CT CORONARY MORPH W/CTA COR W/SCORE 12/12/2019  Addendum 12/12/2019 12:11 PM ADDENDUM REPORT: 12/12/2019 12:09  CLINICAL DATA:  Chest pain  EXAM: Cardiac/Coronary CTA  TECHNIQUE: The patient was scanned on a [employer]. A 100 kV prospective scan was triggered in the descending thoracic aorta at 111 HU's. Axial non-contrast 3 mm slices were carried out through the heart. The data set was analyzed on a dedicated work station and scored using the Agatson method. Gantry rotation speed was 250 msecs and collimation was .6 mm. No beta blockade and 0.8 mg of sl NTG was given. The 3D data set was reconstructed in 5% intervals of the 35-75 % of the R-R cycle. Diastolic phases were analyzed on a dedicated work station using MPR, MIP and VRT modes. The patient received 80 cc of contrast.  FINDINGS: Image quality: excellent.  Noise artifact is: Limited.  Coronary Arteries:  Normal coronary origin.  Left dominance.  Left main: The left main is a large caliber vessel with a normal take off from the left coronary cusp that bifurcates to form a left anterior descending artery and a left circumflex artery. There is no plaque or stenosis.  Left anterior descending artery: The LAD is patent without plaque or stenosis. The LAD gives off 2 patent diagonal branches.  Left circumflex artery: The LCX is dominant without plaque or stenosis. The LCX gives off 2 patent obtuse marginal branches. The LCX terminates as a PDA without  evidence of plaque or stenosis.  Right coronary artery: The RCA is non-dominant with normal take off from the right coronary cusp. There is no plaque or stenosis.  Right Atrium: Right atrial size is within normal limits.  Right Ventricle: The right ventricular cavity is within normal limits.  Left Atrium: Left atrial size is normal in  size with no left atrial appendage filling defect. A small PFO is present.  Left Ventricle: The ventricular cavity size is within normal limits. There are no stigmata of prior infarction. There is no abnormal filling defect.  Pulmonary arteries: The main pulmonary artery is mildly dilated without proximal filling defect.  Pulmonary veins: Normal pulmonary venous drainage.  Pericardium: Normal thickness with no significant effusion or calcium present.  Cardiac valves: The aortic valve is trileaflet without significant calcification. The mitral valve is normal structure without significant calcification.  Aorta: Normal caliber with minimal aortic root sclerosis.  Extra-cardiac findings: See attached radiology report for non-cardiac structures.  IMPRESSION: 1. Coronary calcium score of 0.  2. Normal coronary origin with left dominance.  3. Normal coronary arteries.  4. Small PFO.  5. Mildly dilated pulmonary artery.  RECOMMENDATIONS: 1. No evidence of CAD (0%). Consider non-atherosclerotic causes of chest pain.  Lennie Odor, MD   Electronically Signed By: Lennie Odor On: 12/12/2019 12:09  Narrative EXAM: OVER-READ INTERPRETATION  CT CHEST  The following report is an over-read performed by radiologist Dr. Jeronimo Greaves of [facility] on 12/12/2019. This over-read does not include interpretation of cardiac or coronary anatomy or pathology. The coronary CTA interpretation by the cardiologist is attached.  COMPARISON:  11/23/2019  FINDINGS: Vascular: Aortic atherosclerosis. Tortuous thoracic aorta. No central pulmonary embolism, on this non-dedicated study.  Mediastinum/Nodes: No imaged thoracic adenopathy.  Lungs/Pleura: No pleural fluid.  Clear imaged lungs.  Upper Abdomen: Subcentimeter high right hepatic lobe cyst. Normal imaged portions of the spleen, stomach.  Musculoskeletal: Right mastectomy.  IMPRESSION: 1. No acute findings in  the imaged extracardiac chest. 2. Aortic Atherosclerosis (ICD10-I70.0).  Electronically Signed: By: Jeronimo Greaves M.D. On: 12/12/2019 09:58     ______________________________________________________________________________________________      Risk Assessment/Calculations:             Physical Exam:   VS:  BP 138/70 (BP Location: Right Arm, Patient Position: Sitting, Cuff Size: Normal)   Pulse 84   Ht 4\' 11"  (1.499 m)   Wt 206 lb 3.2 oz (93.5 kg)   SpO2 96%   BMI 41.65 kg/m    Wt Readings from Last 3 Encounters:  03/16/23 206 lb 3.2 oz (93.5 kg)  12/13/22 197 lb 14.4 oz (89.8 kg)  04/04/22 204 lb (92.5 kg)    GEN: Well nourished, well developed in no acute distress. Sitting comfortably on the exam table  NECK: No JVD; No carotid bruits CARDIAC:  RRR, no murmurs, rubs, gallops. Radial pulses 2+ bilaterally  RESPIRATORY:  Clear to auscultation without rales, wheezing or rhonchi. Normal WOB on room air   ABDOMEN: Soft, non-tender, non-distended EXTREMITIES:  No edema in BLE; No deformity   ASSESSMENT AND PLAN: .    Dizziness  Chest discomfort  -About a week and half ago, patient had an episode of dizziness.  She was standing at her kitchen sink when she suddenly felt very hot, sweaty.  She began to feel a bit dizzy and like something was stuck in her throat/chest.  She sat down to rest and took an aspirin, symptoms resolved on their own after about  15 minutes.  Denies associated palpitations or fluttering in her chest. -Patient denies any recurrence of symptoms since.  She has been able to walk around her home and do housework without developing chest pain or shortness of breath.  She denies any palpitations.  Denies dizziness, near-syncope, syncope - EKG today is without ischemic changes  -Patient was a bit concerned that she had developed some chest discomfort during this episode.  She previously had a coronary CTA in 12/2019 that showed a coronary calcium score of  0. -Offered a nuclear stress test, but  patient prefers to hold off for now. I agree this is reasonable as she has not had recurrence of symptoms, does not have chest pain or shortness of breath on exertion, and had a coronary calcium score of 0.  If she has recurrence of symptoms, she knows to let the office know  HTN  - BP currently well controlled. No symptoms of orthostatic hypotension  - Continue losartan 50 mg daily  - Labs are followed by PCP. Creatinine 0.86 in 12/2022   SVT - S/p ablation in 2002 - Patient denies recurrence of palpitations. Denies tachycardia  HLD  - In the past, patient has had elevated LDL. However, she has refused lipid-lowering therapies. Is only on fish oil - Again discussed statin therapy, but patient refused. She prefers to stay on fish oil for now  - Labs followed by PCP   OSA - Excellent compliance with CPAP    Dispo: Follow up in 4 months   Signed, Jonita Albee, PA-C

## 2023-03-16 ENCOUNTER — Ambulatory Visit: Payer: Medicare Other | Attending: Cardiology | Admitting: Cardiology

## 2023-03-16 ENCOUNTER — Encounter: Payer: Self-pay | Admitting: Cardiology

## 2023-03-16 VITALS — BP 138/70 | HR 84 | Ht 59.0 in | Wt 206.2 lb

## 2023-03-16 DIAGNOSIS — I471 Supraventricular tachycardia, unspecified: Secondary | ICD-10-CM

## 2023-03-16 DIAGNOSIS — E782 Mixed hyperlipidemia: Secondary | ICD-10-CM | POA: Diagnosis not present

## 2023-03-16 DIAGNOSIS — R0789 Other chest pain: Secondary | ICD-10-CM | POA: Diagnosis not present

## 2023-03-16 DIAGNOSIS — G4733 Obstructive sleep apnea (adult) (pediatric): Secondary | ICD-10-CM | POA: Diagnosis not present

## 2023-03-16 DIAGNOSIS — I10 Essential (primary) hypertension: Secondary | ICD-10-CM

## 2023-03-16 DIAGNOSIS — R42 Dizziness and giddiness: Secondary | ICD-10-CM

## 2023-03-16 NOTE — Patient Instructions (Signed)
Medication Instructions:  No changes *If you need a refill on your cardiac medications before your next appointment, please call your pharmacy*  Lab Work: No labs  Testing/Procedures: No testing  Follow-Up: At [facility], you and your health needs are our priority.  As part of our continuing mission to provide you with exceptional heart care, we have created designated Provider Care Teams.  These Care Teams include your primary Cardiologist (physician) and Advanced Practice Providers (APPs -  Physician Assistants and Nurse Practitioners) who all work together to provide you with the care you need, when you need it.  We recommend signing up for the patient portal called "MyChart".  Sign up information is provided on this After Visit Summary.  MyChart is used to connect with patients for Virtual Visits (Telemedicine).  Patients are able to view lab/test results, encounter notes, upcoming appointments, etc.  Non-urgent messages can be sent to your provider as well.   To learn more about what you can do with MyChart, go to [email or web address].    Your next appointment:   4 month(s)  Provider:   Robet Leu, PA-C  Other Instructions Follow up with Dr. T[facility]

## 2023-03-20 DIAGNOSIS — R051 Acute cough: Secondary | ICD-10-CM | POA: Diagnosis not present

## 2023-03-20 DIAGNOSIS — Z20822 Contact with and (suspected) exposure to covid-19: Secondary | ICD-10-CM | POA: Diagnosis not present

## 2023-03-20 DIAGNOSIS — J101 Influenza due to other identified influenza virus with other respiratory manifestations: Secondary | ICD-10-CM | POA: Diagnosis not present

## 2023-03-27 DIAGNOSIS — R051 Acute cough: Secondary | ICD-10-CM | POA: Diagnosis not present

## 2023-03-27 DIAGNOSIS — J101 Influenza due to other identified influenza virus with other respiratory manifestations: Secondary | ICD-10-CM | POA: Diagnosis not present

## 2023-06-19 ENCOUNTER — Ambulatory Visit: Payer: Medicare Other | Attending: Cardiovascular Disease | Admitting: Cardiovascular Disease

## 2023-06-19 ENCOUNTER — Encounter: Payer: Self-pay | Admitting: Cardiovascular Disease

## 2023-06-19 DIAGNOSIS — G4733 Obstructive sleep apnea (adult) (pediatric): Secondary | ICD-10-CM

## 2023-06-19 DIAGNOSIS — I10 Essential (primary) hypertension: Secondary | ICD-10-CM | POA: Diagnosis not present

## 2023-06-19 DIAGNOSIS — R5383 Other fatigue: Secondary | ICD-10-CM

## 2023-06-19 NOTE — Patient Instructions (Signed)
 Medication Instructions:  NO CHANGES *If you need a refill on your cardiac medications before your next appointment, please call your pharmacy*  Lab Work: NO LABS If you have labs (blood work) drawn today and your tests are completely normal, you will receive your results only by: MyChart Message (if you have MyChart) OR A paper copy in the mail If you have any lab test that is abnormal or we need to change your treatment, we will call you to review the results.  Testing/Procedures: NO TESTING  Follow-Up: At [facility], you and your health needs are our priority.  As part of our continuing mission to provide you with exceptional heart care, our providers are all part of one team.  This team includes your primary Cardiologist (physician) and Advanced Practice Providers or APPs (Physician Assistants and Nurse Practitioners) who all work together to provide you with the care you need, when you need it.  Your next appointment:   1 year(s)  Provider:   Gaylyn Keas, MD

## 2023-06-19 NOTE — Progress Notes (Signed)
 Cardiology Office Note    Date:  06/19/2023   ID:  Tiffany Gilmore, DOB 01-Apr-1945, MRN [id number]  PCP:  Virgle Grime, MD  Cardiologist:  Magnus Schuller, MD (sleep); Dr. Maryalice Smaller  3 1/2 follow-up sleep evaluation   History of Present Illness:  Tiffany Gilmore is a 78 y.o. female who is followed by Dr. Maryalice Smaller for cardiology care and Dr. Lauralee Poll on for primary care.  I saw her for initial sleep evaluation on January 13, 2021 and have not seen her since.  She presents for a 3-1/2-year follow-up evaluation.   Ms. Tiffany Gilmore has a history of hypertension, hyperlipidemia, and due to concerns for obstructive sleep apnea referred for a sleep study which was done as a PSG on January 13, 2020.  Symptoms included snoring, frequent awakenings, awakening gasping for breath on her back, daytime somnolence, fatigue, hypertension and obesity.  She was found to have moderate overall sleep apnea with an AHI of 23 but respiratory disturbance was further increased to 35.1.  She had severe sleep apnea during REM sleep with an AHI of 63.4.  She had significant oxygen desaturation to nadir 78%.  She underwent a CPAP titration study on March 11, 2020.  CPAP was initiated at 16 cm and was titrated up to 18 cm of water .  AHI was 0.41 minutes of REM sleep at 18 cm and 18 nadir was 89%.  It was initially recommended  to start CPAP auto therapy with an EPR of 3 and a 15 to 20 cm range.  I saw her for my initial sleep evaluation in December 2022.  Her CPAP set up date was October 19, 2020 when she received a new ResMed air sense 11 AutoSet unit with [employer] as her DME company.  Download was obtained in the office today from November 15 through January 12, 2021.  She is meeting compliance with usage days at 100%.  She is compliant with usage greater than 4 hours with average use of 5 hours and 53 minutes.  Her AHI is 1.9 and her 95th percentile pressure 16.6 with a maximum average pressure of 17.2.  She  typically goes to bed between 430 and 5 AM and often wakes up between 9 and 10:30 AM.  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  She usually starts work at home around noon and then works in the office from 3:30 until 11 PM but at times she still has work to do when she goes home.  Since initiating CPAP therapy, her sleep is much improved.  She denies any daytime sleepiness and Epworth scale score was calculated in the office today and this endorsed at 6.  She is unaware of breakthrough snoring.  She has more energy.  She is on losartan  50 mg daily for hypertension and takes omega-3 fatty acid.    Since I last saw her, she has been followed by Dr. Lucy Sack and last saw him on January 06, 2022.  Most recently she has been seen by Liane Redman, PA-C March 16, 2023.  Presently, from a sleep perspective she tells me she typically goes to bed around 4:30 AM and wakes up around 10:30 AM suggestive of significant circadian delay.  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  She typically begins work around noon until 8 PM.  Then she feels like she needs to relax for several hours where she watches  television, does [employer] and reads.  Typically around 1 AM she returns to work until 4 AM and then goes to bed at 4:30 AM.  She does not exercise.  She goes to the office on Tuesday and Thursdays.  She states her husband typically goes to bed at 2 AM.  CPAP with 100% compliance.  A download was obtained from April 22,025 to Jun 18, 2023.  Usage days is 100% with average use is suboptimal at only 5 hours and 7 minutes.  Her ResMed air sense 11 AutoSet unit is set at a pressure range of 15 to 20 cm.  AHI is excellent at 0.8.  At times there is mild leak.  95th percentile pressure is 16.2 with maximum average pressure 16.5.  She presents for evaluation.   Past Medical History:  Diagnosis Date   Arthritis    Cancer (HCC)    breast R    Cataract    Endometrial cancer (HCC)    GERD (gastroesophageal reflux disease)    Hyperlipidemia    Hypertension    SVT (supraventricular tachycardia) (HCC)    Ablation in 2002 to treat    Past Surgical History:  Procedure Laterality Date   ABLATION     Cardiac ablation   BREAST SURGERY     CATARACT EXTRACTION W/ INTRAOCULAR LENS IMPLANT     CESAREAN SECTION     EYE SURGERY     ROBOTIC ASSISTED LAPAROSCOPIC LYSIS OF ADHESION N/A 11/18/2019   Procedure: XI ROBOTIC ASSISTED LAPAROSCOPIC LYSIS OF ADHESION;  Surgeon: Alphonso Aschoff, MD;  Location: [facility];  Service: Gynecology;  Laterality: N/A;   ROBOTIC ASSISTED TOTAL HYSTERECTOMY WITH BILATERAL SALPINGO OOPHERECTOMY Bilateral 11/18/2019   Procedure: XI ROBOTIC ASSISTED TOTAL HYSTERECTOMY WITH BILATERAL SALPINGO OOPHORECTOMY;  Surgeon: Alphonso Aschoff, MD;  Location: [facility];  Service: Gynecology;  Laterality: Bilateral;   SENTINEL NODE BIOPSY N/A 11/18/2019   Procedure: SENTINEL NODE BIOPSY;  Surgeon: Alphonso Aschoff, MD;  Location: [facility];  Service: Gynecology;  Laterality: N/A;    Current Medications: Outpatient Medications Prior to Visit  Medication Sig Dispense Refill   fish oil-omega-3 fatty acids 1000 MG capsule Take 2 g by mouth daily.      losartan  (COZAAR ) 50 MG tablet TAKE 1 TABLET(50 MG) BY MOUTH DAILY 90 tablet 3   Multiple Vitamins-Minerals (MULTIVITAMIN ADULTS PO) Take 1 tablet by mouth daily.     No facility-administered medications prior to visit.     Allergies:   Aspirin, Penicillin g, Sulfa antibiotics, and Sulfamethoxazole-trimethoprim   Social History   Socioeconomic History   Marital status: Married    Spouse name: Not on file   Number of children: 2   Years of education: Not on file   Highest education level: Not on file  Occupational History   Occupation: working as Airline pilot  Tobacco Use   Smoking status: Never   Smokeless tobacco: Never  Vaping Use   Vaping status: Never Used  Substance and Sexual  Activity   Alcohol use: No   Drug use: No   Sexual activity: Yes  Other Topics Concern   Not on file  Social History Narrative   Not on file   Social Drivers of Health   Financial Resource Strain: Not on file  Food Insecurity: Not on file  Transportation Needs: Not on file  Physical Activity: Not on file  Stress: Not on file  Social Connections: Not on file    Socially, she is married for greater than 60 years.  She has 3 children.  She is a self-employed Airline pilot.  She does not exercise use tobacco or drink alcohol.   Family History:  The patient's family history includes Heart disease in her brother.   Her mother died at age 96 with colon cancer.  Her father died at age 33 with a stroke.  She has 5 living brothers and 1 deceased brother who died in his sleep at age 67.  She had 1 sister who is deceased with kidney failure at age 60.  ROS General: Negative; No fevers, chills, or night sweats;  HEENT: Negative; No changes in vision or hearing, sinus congestion, difficulty swallowing Pulmonary: Negative; No cough, wheezing, shortness of breath, hemoptysis Cardiovascular: Negative; No chest pain, presyncope, syncope, palpitations GI: Negative; No nausea, vomiting, diarrhea, or abdominal pain GU: Negative; No dysuria, hematuria, or difficulty voiding Musculoskeletal: Negative; no myalgias, joint pain, or weakness Hematologic/Oncology: Negative; no easy bruising, bleeding Endocrine: Negative; no heat/cold intolerance; no diabetes Neuro: Negative; no changes in balance, headaches Skin: Negative; No rashes or skin lesions Psychiatric: Negative; No behavioral problems, depression Sleep: Negative; No snoring, daytime sleepiness, hypersomnolence, bruxism, restless legs, hypnogognic hallucinations, no cataplexy Other comprehensive 14 point system review is negative.   PHYSICAL EXAM:   VS:  BP 128/80   Pulse 82   Ht 4\' 11"  (1.499 m)   Wt 202 lb (91.6 kg)   SpO2 98%   BMI 40.80  kg/m     Repeat blood pressure by me was 140/78  Wt Readings from Last 3 Encounters:  06/19/23 202 lb (91.6 kg)  03/16/23 206 lb 3.2 oz (93.5 kg)  12/13/22 197 lb 14.4 oz (89.8 kg)    General: Alert, oriented, no distress.  Morbid obesity Skin: normal turgor, no rashes, warm and dry HEENT: Normocephalic, atraumatic. Pupils equal round and reactive to light; sclera anicteric; extraocular muscles intact;  Nose without nasal septal hypertrophy Mouth/Parynx benign; Mallinpatti scale 4 Neck: Thick neck ;no JVD, no carotid bruits; normal carotid upstroke Lungs: clear to ausculatation and percussion; no wheezing or rales Chest wall: without tenderness to palpitation Heart: PMI not displaced, RRR, s1 s2 normal, 1/6 systolic murmur, no diastolic murmur, no rubs, gallops, thrills, or heaves Abdomen: Central adiposity; soft, nontender; no hepatosplenomehaly, BS+; abdominal aorta nontender and not dilated by palpation. Back: no CVA tenderness Pulses 2+ Musculoskeletal: full range of motion, normal strength, no joint deformities Extremities: Trace ankle edema bilaterally; no clubbing cyanosis, Homan's sign negative  Neurologic: grossly nonfocal; Cranial nerves grossly wnl Psychologic: Normal mood and affect    Studies/Labs Reviewed:   EKG Interpretation Date/Time:  Tuesday Jun 19 2023 11:49:28 EDT Ventricular Rate:  82 PR Interval:  166 QRS Duration:  74 QT Interval:  388 QTC Calculation: 453 R Axis:   54  Text Interpretation: Normal sinus rhythm Biatrial enlargement When compared with ECG of 16-Mar-2023 10:06, No significant change was found Confirmed by Magnus Schuller ([id number]) on 06/19/2023 4:47:54 PM    January 13, 2021 ECG (independently read by me):  NSR at 80; LAE, QTc 468 msec  Recent Labs:    Latest Ref Rng & Units 09/21/2020    7:17 PM 11/23/2019    2:29 PM 11/19/2019    4:49 AM  BMP  Glucose 70 - 99 mg/dL 96  98  604   BUN 8 - 23 mg/dL 9  12  8    Creatinine 0.44 - 1.00  mg/dL 5.40  9.81  1.91   Sodium 135 - 145 mmol/L 140  147  139   Potassium 3.5 - 5.1 mmol/L 4.1  3.5  4.3   Chloride 98 - 111 mmol/L 107  108  106   CO2 22 - 32 mmol/L 26  26  25    Calcium 8.9 - 10.3 mg/dL 8.9  9.5  8.8         Latest Ref Rng & Units 09/21/2020    7:17 PM 11/10/2019   11:39 AM 10/11/2019    4:51 PM  Hepatic Function  Total Protein 6.5 - 8.1 g/dL 7.5  7.8  8.0   Albumin 3.5 - 5.0 g/dL 3.9  4.1  4.0   AST 15 - 41 U/L 28  23  24    ALT 0 - 44 U/L 19  16  19    Alk Phosphatase 38 - 126 U/L 76  77  84   Total Bilirubin 0.3 - 1.2 mg/dL 0.7  0.8  0.6        Latest Ref Rng & Units 09/21/2020    7:17 PM 11/23/2019    2:29 PM 11/19/2019    4:49 AM  CBC  WBC 4.0 - 10.5 K/uL 4.3  6.1  9.0   Hemoglobin 12.0 - 15.0 g/dL 40.9  81.1  91.4   Hematocrit 36.0 - 46.0 % 41.2  42.6  38.9   Platelets 150 - 400 K/uL 198  279  273    Lab Results  Component Value Date   MCV 86.2 09/21/2020   MCV 87.7 11/23/2019   MCV 86.4 11/19/2019   No results found for: "TSH" No results found for: "HGBA1C"   BNP No results found for: "BNP"  ProBNP No results found for: "PROBNP"   Lipid Panel  No results found for: "CHOL", "TRIG", "HDL", "CHOLHDL", "VLDL", "LDLCALC", "LDLDIRECT", "LABVLDL"   RADIOLOGY: No results found.   Additional studies/ records that were reviewed today include:   01/12/2021 CLINICAL INFORMATION Sleep Study Type: NPSG   Indication for sleep study: Excessive Daytime Sleepiness, Fatigue, Hypertension, Obesity, Snoring   Epworth Sleepiness Score: 3   SLEEP STUDY TECHNIQUE As per the AASM Manual for the Scoring of Sleep and Associated Events v2.3 (April 2016) with a hypopnea requiring 4% desaturations.   The channels recorded and monitored were frontal, central and occipital EEG, electrooculogram (EOG), submentalis EMG (chin), nasal and oral airflow, thoracic and abdominal wall motion, anterior tibialis EMG, snore microphone, electrocardiogram, and pulse  oximetry.   MEDICATIONS aspirin 81 MG tablet fish oil-omega-3 fatty acids 1000 MG capsule losartan  (COZAAR ) 50 MG tablet senna-docusate (SENOKOT-S) 8.6-50 MG tablet traMADol  (ULTRAM ) 50 MG tablet Medications self-administered by patient taken the night of the study : N/A   SLEEP ARCHITECTURE The study was initiated at 10:22:34 PM and ended at 4:44:32 AM.   Sleep onset time was 5.4 minutes and the sleep efficiency was 79.2%%. The total sleep time was 302.5 minutes.   Stage REM latency was 94.5 minutes.   The patient spent 11.9%% of the night in stage N1 sleep, 67.4%% in stage N2 sleep, 0.0%% in stage N3 and 20.7% in REM.   Alpha intrusion was absent.   Supine sleep was 66.61%.   RESPIRATORY PARAMETERS The overall apnea/hypopnea index (AHI) was 23.0 per hour. The respiratory disturbance index (RDI) was 35.1/h. There were 47 total apneas, including 46 obstructive, 1 central and 0 mixed apneas. There were 69 hypopneas and 61 RERAs.   The AHI during Stage REM sleep was 63.4 per hour.   AHI while supine was 27.1 per hour.   The  mean oxygen saturation was 94.8%. The minimum SpO2 during sleep was 78.0%.   Moderate snoring was noted during this study.   CARDIAC DATA The 2 lead EKG demonstrated sinus rhythm. The mean heart rate was 79.6 beats per minute. Other EKG findings include: PVCs.   LEG MOVEMENT DATA The total PLMS were 0 with a resulting PLMS index of 0.0. Associated arousal with leg movement index was 0.0 .   IMPRESSIONS - Moderate obstructive sleep apnea occurred during this study (AHI  23.0/h; RDI 35.1/h); however, events were very severe during REM sleep (AHI 63.4/h).). - No significant central sleep apnea occurred during this study (CAI = 0.2/h). - Significant oxygen desaturation to a nadir of 78%. - The patient snored with moderate snoring volume. - EKG findings include PVCs. - Clinically significant periodic limb movements did not occur during sleep. No significant  associated arousals.   DIAGNOSIS - Obstructive Sleep Apnea (G47.33) - Nocturnal Hypoxemia (G47.36)   RECOMMENDATIONS - Therapeutic CPAP titration to determine optimal pressure required to alleviate sleep disordered breathing. - Effort should be made to optimize nasal and oropharyngeal patency. - Positional therapy avoiding supine position during sleep. - Avoid alcohol, sedatives and other CNS depressants that may worsen sleep apnea and disrupt normal sleep architecture. - Sleep hygiene should be reviewed to assess factors that may improve sleep quality. - Weight management (BMI 41) and regular exercise should be initiated or continued if appropriate.  ___________________________________________________________________________________  03/11/2020 CLINICAL INFORMATION The patient is referred for a CPAP titration to treat sleep apnea.   Date of NPSG:  01/13/2020:  AHI 23/h; RDI 35/h; supine AHI 27/h; REM AHI 63.4/h; O2 nadir 78%.   SLEEP STUDY TECHNIQUE As per the AASM Manual for the Scoring of Sleep and Associated Events v2.3 (April 2016) with a hypopnea requiring 4% desaturations.   The channels recorded and monitored were frontal, central and occipital EEG, electrooculogram (EOG), submentalis EMG (chin), nasal and oral airflow, thoracic and abdominal wall motion, anterior tibialis EMG, snore microphone, electrocardiogram, and pulse oximetry. Continuous positive airway pressure (CPAP) was initiated at the beginning of the study and titrated to treat sleep-disordered breathing.   MEDICATIONS fish oil-omega-3 fatty acids 1000 MG capsule hydrochlorothiazide  (HYDRODIURIL ) 25 MG tablet losartan  (COZAAR ) 50 MG tablet (Expired) Medications self-administered by patient taken the night of the study : N/A   TECHNICIAN COMMENTS Comments added by technician: PT HAD ONE RESTROOM VISTED. Patient had difficulty initiating sleep. Comments added by scorer: N/A   RESPIRATORY PARAMETERS Optimal PAP  Pressure (cm):  18        AHI at Optimal Pressure (/hr):            0.0 Overall Minimal O2 (%):         84.0     Supine % at Optimal Pressure (%):    100 Minimal O2 at Optimal Pressure (%): 89.0        SLEEP ARCHITECTURE The study was initiated at 9:35:49 PM and ended at 4:49:35 AM.   Sleep onset time was 20.4 minutes and the sleep efficiency was 71.1%%. The total sleep time was 308.5 minutes.   The patient spent 5.8%% of the night in stage N1 sleep, 79.3%% in stage N2 sleep, 0.0%% in stage N3 and 14.9% in REM.Stage REM latency was 365.0 minutes   Wake after sleep onset was 104.9. Alpha intrusion was absent. Supine sleep was 71.64%.   CARDIAC DATA The 2 lead EKG demonstrated sinus rhythm. The mean heart rate was 78.6 beats per minute.  Other EKG findings include: rare PVC   LEG MOVEMENT DATA The total Periodic Limb Movements of Sleep (PLMS) were 0. The PLMS index was 0.0. A PLMS index of <15 is considered normal in adults.   IMPRESSIONS - CPAP was initiated at 6 cm and titrated to optimal PAP at 18 cm of water ; AHI 0 with 41 minutes of REM sleep, O2 nadir 89%. - Central sleep apnea was not noted during this titration (CAI = 0.0/h). - Moderate oxygen desaturations to a nadir of 84.0% at 17 cm. - Elimination of snoring at 18 cm of water .  - Rare PVCs were observed during this study. - Clinically significant periodic limb movements were not noted during this study. Arousals associated with PLMs were rare.   DIAGNOSIS - Obstructive Sleep Apnea (G47.33)   RECOMMENDATIONS - Recommend an initial trial of CPAP Auto therapy with EPR of 3 at 15 - 20 cm H2O with heated humidification.  A Medium size Resmed Full Face Mask AirFit F20 mask was used for the titration. - Effort should be made to optimize nasal and oropharyngeal patency. - Avoid alcohol, sedatives and other CNS depressants that may worsen sleep apnea and disrupt normal sleep architecture. - Sleep hygiene should be reviewed to assess  factors that may improve sleep quality. - Weight management and regular exercise should be initiated or continued. - Recommend a download in 30 days and sleep clinic evaluation after 4 weeks of therapy  ASSESSMENT:    1. OSA (obstructive sleep apnea) on CPAP   2. Primary hypertension   3. Obesity, morbid, BMI 40.0-49.9 (HCC)   4. Fatigue, unspecified type     PLAN:  Mrs. 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  She had significant oxygen desaturation to nadir 78%.  Prior to initiating CPAP therapy, she consistently had significant difficulty sleeping on her back, snoring, frequent awakenings, awakening gasping for breath, and nonrestorative sleep.  She has been on CPAP therapy since her set up date of October 19, 2020.  There was significant delay in getting a new ResMed air sense 11 AutoSet unit due to supply chain issues.  However since initiating CPAP therapy on October 19, 2020 she has noticed marked benefit in her sleep pattern.  When I saw her for my initial sleep evaluation I reviewed her download and she was meeting compliance standards but sleep duration was suboptimal and less than 6 hours per night.   Typically she may not be going to bed until 4:30 or 5 AM and often wakes up between 9 and 10:30 AM.  We discussed sleep hygiene.  On her current download with her unit is set between 15 and 20 cm of water , AHI is excellent at 1.9/h with 95th percentile pressure at 16.6 and maximum average pressure 17.2.  She is using a ResMed AirFit N 30i mask.  During her initial evaluation I had a lengthy discussion with her regarding effects of sleep apnea on normal sleep architecture.  We also discussed adverse consequences with reference to  cardiovascular health particularly with blood pressure control, nocturnal arrhythmias, increased potential risk for atrial fibrillation, effects on inflammation, glucose, GERD, as well as potential for nocturnal ischemia resulting from nocturnal hypoxemia.  BMI is consistent with morbid obesity.  We  discussed the need for improved diet and exercise with weight loss.  We discussed pathophysiology associated with increased nocturia in the setting of sleep apnea.  I have not seen her in 3-1/2 years.  There has not been any significant change in her typical work habits.  She really has a circadian delay with her typical sleep initiating at 4:30 AM and currently being up at 10:30 AM.  I again stressed the importance of trying to improve sleep hygiene.  I suggested that she should try to get to bed earlier and should consider initially trying to go to bed at 2 AM with her husband rather than staying up for another 2-1/2 hours.  Ideally we discussed using CPAP for a minimum of 7 hours with optimal at 8 hours per night if possible.  She is morbidly obese and does not have time for any exercise with her current sleep and work schedule.  I also discussed that if she were only to sleep and brief periods she would have a significant reduction in deep and REM sleep which may adversely affect her long-term.  I discussed the importance of exercise and weight loss.  She has lost 8 pounds over the past 3-1/2 years since my initial evaluation.  I discussed with her my plans for retirement at the end of next month.  She will be transition to the care of Dr. Micael Adas for sleep evaluation in 1 year or sooner as needed.   Medication Adjustments/Labs and Tests Ordered: Current medicines are reviewed at length with the patient today.  Concerns regarding medicines are outlined above.  Medication changes, Labs and Tests ordered today are listed in the Patient Instructions below. Patient Instructions  Medication Instructions:  NO  CHANGES *If you need a refill on your cardiac medications before your next appointment, please call your pharmacy*  Lab Work: NO LABS If you have labs (blood work) drawn today and your tests are completely normal, you will receive your results only by: MyChart Message (if you have MyChart) OR A paper copy in the mail If you have any lab test that is abnormal or we need to change your treatment, we will call you to review the results.  Testing/Procedures: NO TESTING  Follow-Up: At [facility], you and your health needs are our priority.  As part of our continuing mission to provide you with exceptional heart care, our providers are all part of one team.  This team includes your primary Cardiologist (physician) and Advanced Practice Providers or APPs (Physician Assistants and Nurse Practitioners) who all work together to provide you with the care you need, when you need it.  Your next appointment:   1 year(s)  Provider:   Gaylyn Keas, MD   Signed, Magnus Schuller, MD, [facility], ABSM Diplomate, American Board of Sleep Medicine  06/19/2023 5:03 PM    [facility] [address], [place], [place]  [address] Phone: [phone]

## 2023-07-08 NOTE — Progress Notes (Unsigned)
  Cardiology Office Note   Date:  07/08/2023  ID:  Tiffany Gilmore, DOB 01/30/1946, MRN [id number] PCP: Virgle Grime, MD  [facility] Providers Cardiologist:  Oneil Bigness, MD    History of Present Illness Tiffany Gilmore is a 78 y.o. female  with a past medical history of SVT s/p ablation, HTN, HLD, OSA, obesity, endometrial cancer s/p total hysterectomy and bilateral salpingoophorectomy. She is followed by Dr. Rolm Clos and presents today for an annual follow up appointment.    Patient previously had a SVT ablation in 2002. Later underwent coronary CTA in 12/2019 that showed a coronary calcium score of 0, no evidence of CAD.    I saw patient in clinic on 03/16/23. At that time, patient reported having an episode of dizziness and chest pressure/feeling like something was in her throat. Episode occurred about 1 week prior to her appointment. Episode lasted 15 minutes then resolved. Had not recurred. A stress test was offered, but patient declined. Her BP was well controlled and she did not have palpitations or symptoms concerning for SVT.   Dizziness  Chest discomfort  - When seen in 03/2023, patient reported having an episode of chest pain and dizziness a week prior to her appointment. Symptoms seemed to be an isolated event, lasted 15 minutes then resolved. Offered stress test, but she declined - Coronary CTA in 2021 showed a coronary calcium score of 0 and no evidence of CAD    HTN  - BP currently well controlled. No symptoms of orthostatic hypotension  - Continue losartan  50 mg daily  - Labs are followed by PCP. Creatinine 0.86 in 12/2022 ***    SVT - S/p ablation in 2002 - Patient denies recurrence of palpitations. Denies tachycardia   HLD  - In the past, patient has had elevated LDL. However, she has refused lipid-lowering therapies. Is only on fish oil - Again discussed statin therapy, but patient refused. She prefers to stay on fish oil for now  - Labs followed by PCP     OSA - Excellent compliance with CPAP   ROS: ***  Studies Reviewed      *** Risk Assessment/Calculations {Does this patient have ATRIAL FIBRILLATION?:[id number]} No BP recorded.  {Refresh Note OR Click here to enter BP  :1}***       Physical Exam VS:  There were no vitals taken for this visit.   Wt Readings from Last 3 Encounters:  06/19/23 202 lb (91.6 kg)  03/16/23 206 lb 3.2 oz (93.5 kg)  12/13/22 197 lb 14.4 oz (89.8 kg)    GEN: Well nourished, well developed in no acute distress NECK: No JVD; No carotid bruits CARDIAC: ***RRR, no murmurs, rubs, gallops RESPIRATORY:  Clear to auscultation without rales, wheezing or rhonchi  ABDOMEN: Soft, non-tender, non-distended EXTREMITIES:  No edema; No deformity   ASSESSMENT AND PLAN ***    {Are you ordering a CV Procedure (e.g. stress test, cath, DCCV, TEE, etc)?   Press F2        :[id number]}  Dispo: ***  Signed, Debria Fang, PA-C

## 2023-07-12 ENCOUNTER — Encounter: Payer: Self-pay | Admitting: Cardiology

## 2023-07-12 ENCOUNTER — Ambulatory Visit: Payer: Medicare Other | Attending: Cardiology | Admitting: Cardiology

## 2023-07-12 VITALS — BP 124/70 | HR 82 | Ht 59.0 in | Wt 210.2 lb

## 2023-07-12 DIAGNOSIS — R42 Dizziness and giddiness: Secondary | ICD-10-CM | POA: Insufficient documentation

## 2023-07-12 DIAGNOSIS — I10 Essential (primary) hypertension: Secondary | ICD-10-CM | POA: Diagnosis not present

## 2023-07-12 DIAGNOSIS — E782 Mixed hyperlipidemia: Secondary | ICD-10-CM | POA: Insufficient documentation

## 2023-07-12 DIAGNOSIS — G4733 Obstructive sleep apnea (adult) (pediatric): Secondary | ICD-10-CM | POA: Insufficient documentation

## 2023-07-12 DIAGNOSIS — I471 Supraventricular tachycardia, unspecified: Secondary | ICD-10-CM | POA: Insufficient documentation

## 2023-07-12 DIAGNOSIS — R0789 Other chest pain: Secondary | ICD-10-CM | POA: Insufficient documentation

## 2023-07-12 NOTE — Patient Instructions (Signed)
 Medication Instructions:  No changes *If you need a refill on your cardiac medications before your next appointment, please call your pharmacy*  Lab Work: No labs  Testing/Procedures: No testing  Follow-Up: At [facility], you and your health needs are our priority.  As part of our continuing mission to provide you with exceptional heart care, our providers are all part of one team.  This team includes your primary Cardiologist (physician) and Advanced Practice Providers or APPs (Physician Assistants and Nurse Practitioners) who all work together to provide you with the care you need, when you need it.  Your next appointment:   1 year(s)  Provider:   Oneil Bigness, MD    We recommend signing up for the patient portal called MyChart.  Sign up information is provided on this After Visit Summary.  MyChart is used to connect with patients for Virtual Visits (Telemedicine).  Patients are able to view lab/test results, encounter notes, upcoming appointments, etc.  Non-urgent messages can be sent to your provider as well.   To learn more about what you can do with MyChart, go to [email or web address].   Other Instructions:

## 2023-08-06 ENCOUNTER — Other Ambulatory Visit: Payer: Self-pay

## 2023-08-06 MED ORDER — LOSARTAN POTASSIUM 50 MG PO TABS: 50.0000 mg | ORAL_TABLET | Freq: Every day | ORAL | 3 refills | Status: AC

## 2023-09-18 ENCOUNTER — Encounter: Payer: Self-pay | Admitting: Obstetrics & Gynecology

## 2023-09-26 ENCOUNTER — Inpatient Hospital Stay: Attending: Obstetrics & Gynecology | Admitting: Obstetrics & Gynecology

## 2023-09-26 ENCOUNTER — Encounter: Payer: Self-pay | Admitting: Obstetrics & Gynecology

## 2023-09-26 VITALS — BP 160/72 | HR 93 | Temp 98.1°F | Resp 20 | Wt 213.6 lb

## 2023-09-26 DIAGNOSIS — C541 Malignant neoplasm of endometrium: Secondary | ICD-10-CM

## 2023-09-26 DIAGNOSIS — R030 Elevated blood-pressure reading, without diagnosis of hypertension: Secondary | ICD-10-CM | POA: Insufficient documentation

## 2023-09-26 DIAGNOSIS — Z8542 Personal history of malignant neoplasm of other parts of uterus: Secondary | ICD-10-CM | POA: Diagnosis not present

## 2023-09-26 NOTE — Progress Notes (Signed)
 Follow Up Note: Gyn-Onc  Tiffany Gilmore 78 y.o. female  CC: She presents for a f/u visit   HPI: The oncology history was reviewed.  Interval History: She denies any vaginal bleeding, cough, lethargy or increasing abdominal girth.  Discussed the use of AI scribe software for clinical note transcription with the patient, who gave verbal consent to proceed.    Review of Systems  Review of Systems  Constitutional:  Negative for malaise/fatigue and weight loss.  Respiratory:  Negative for shortness of breath and wheezing.   Cardiovascular:  Negative for chest pain and leg swelling.  Gastrointestinal:  Negative for abdominal pain, blood in stool, constipation, nausea and vomiting.  Genitourinary:  Negative for dysuria, frequency, hematuria and urgency.  Musculoskeletal:  Negative for joint pain and myalgias.  Neurological:  Negative for weakness.  Psychiatric/Behavioral:  Negative for depression. The patient does not have insomnia.    Current medications, allergy, social history, past surgical history, past medical history, family history were all reviewed.    Vitals:  BP (!) 160/72 Comment: manual recheck, pt to monitor and follow up with PCP. MD aware  Pulse 93   Temp 98.1 F (36.7 C) (Oral)   Resp 20   Wt 213 lb 9.6 oz (96.9 kg)   SpO2 97%   BMI 43.14 kg/m     Physical Exam Exam conducted with a chaperone present.  Constitutional:      General: She is not in acute distress. Cardiovascular:     Rate and Rhythm: Normal rate and regular rhythm.  Pulmonary:     Effort: Pulmonary effort is normal.     Breath sounds: Normal breath sounds. No wheezing or rhonchi.  Abdominal:     Palpations: Abdomen is soft.     Tenderness: There is no abdominal tenderness. There is no right CVA tenderness or left CVA tenderness.     Hernia: No hernia is present.  Genitourinary:    General: Normal vulva.     Urethra: No urethral lesion.     Vagina: No lesions. No bleeding.  Foreshortened Musculoskeletal:     Cervical back: Neck supple.     Right lower leg: No edema.     Left lower leg: No edema.  Lymphadenopathy:     Upper Body:     Right upper body: No supraclavicular adenopathy.     Left upper body: No supraclavicular adenopathy.     Lower Body: No right inguinal adenopathy. No left inguinal adenopathy.  Skin:    Findings: No rash.  Neurological:     Mental Status: She is oriented to person, place, and time.   Assessment/Plan:  Endometrial adenocarcinoma (HCC) H/O stage IA grade 1 endometrial cancer (MMRd, MLH1 hypermethylation present). S/p staging surgery in September, 2021. Negative symptom review, normal exam.  No evidence of recurrence      >continue to have follow-up every 6 months for 5 years in accordance with NCCN guidelines     I personally spent 25 minutes face-to-face and non-face-to-face in the care of this patient, which includes all pre, intra, and post visit time on the date of service.    [place]lam Mill, MD

## 2023-09-26 NOTE — Assessment & Plan Note (Addendum)
 H/O stage IA grade 1 endometrial cancer (MMRd, MLH1 hypermethylation present). S/p staging surgery in September, 2021. Negative symptom review, normal exam.  No evidence of recurrence     >continue to have follow-up every 6 months for 5 years in accordance with NCCN guidelines.

## 2023-09-26 NOTE — Patient Instructions (Signed)
 Return in 6 months

## 2023-10-12 DIAGNOSIS — Z961 Presence of intraocular lens: Secondary | ICD-10-CM | POA: Diagnosis not present

## 2023-10-12 DIAGNOSIS — H532 Diplopia: Secondary | ICD-10-CM | POA: Diagnosis not present

## 2023-10-12 DIAGNOSIS — H5000 Unspecified esotropia: Secondary | ICD-10-CM | POA: Diagnosis not present

## 2023-10-22 DIAGNOSIS — Z1231 Encounter for screening mammogram for malignant neoplasm of breast: Secondary | ICD-10-CM | POA: Diagnosis not present

## 2023-10-31 DIAGNOSIS — H532 Diplopia: Secondary | ICD-10-CM | POA: Diagnosis not present
